# Patient Record
Sex: Male | Born: 1935 | Race: White | Hispanic: No | Marital: Married | Smoking: Former smoker
Health system: Southern US, Community
[De-identification: ages and names within clinical notes are randomized; demographics above are authoritative.]

## PROBLEM LIST (undated history)

## (undated) DIAGNOSIS — IMO0002 Reserved for concepts with insufficient information to code with codable children: Secondary | ICD-10-CM

## (undated) DIAGNOSIS — K8689 Other specified diseases of pancreas: Secondary | ICD-10-CM

## (undated) DIAGNOSIS — I472 Ventricular tachycardia: Secondary | ICD-10-CM

## (undated) DIAGNOSIS — I10 Essential (primary) hypertension: Secondary | ICD-10-CM

## (undated) DIAGNOSIS — C3490 Malignant neoplasm of unspecified part of unspecified bronchus or lung: Secondary | ICD-10-CM

## (undated) DIAGNOSIS — I2510 Atherosclerotic heart disease of native coronary artery without angina pectoris: Secondary | ICD-10-CM

## (undated) DIAGNOSIS — Z7901 Long term (current) use of anticoagulants: Secondary | ICD-10-CM

## (undated) DIAGNOSIS — N400 Enlarged prostate without lower urinary tract symptoms: Secondary | ICD-10-CM

## (undated) DIAGNOSIS — C439 Malignant melanoma of skin, unspecified: Secondary | ICD-10-CM

## (undated) DIAGNOSIS — S301XXA Contusion of abdominal wall, initial encounter: Secondary | ICD-10-CM

## (undated) DIAGNOSIS — I4891 Unspecified atrial fibrillation: Secondary | ICD-10-CM

## (undated) DIAGNOSIS — I4892 Unspecified atrial flutter: Secondary | ICD-10-CM

## (undated) DIAGNOSIS — Z923 Personal history of irradiation: Secondary | ICD-10-CM

## (undated) DIAGNOSIS — K922 Gastrointestinal hemorrhage, unspecified: Secondary | ICD-10-CM

## (undated) DIAGNOSIS — I4729 Other ventricular tachycardia: Secondary | ICD-10-CM

## (undated) DIAGNOSIS — K279 Peptic ulcer, site unspecified, unspecified as acute or chronic, without hemorrhage or perforation: Secondary | ICD-10-CM

## (undated) DIAGNOSIS — C4490 Unspecified malignant neoplasm of skin, unspecified: Secondary | ICD-10-CM

## (undated) DIAGNOSIS — R0989 Other specified symptoms and signs involving the circulatory and respiratory systems: Secondary | ICD-10-CM

## (undated) HISTORY — DX: Ventricular tachycardia: I47.2

## (undated) HISTORY — DX: Unspecified malignant neoplasm of skin, unspecified: C44.90

## (undated) HISTORY — PX: CHOLECYSTECTOMY: SHX55

## (undated) HISTORY — DX: Long term (current) use of anticoagulants: Z79.01

## (undated) HISTORY — PX: LUNG BIOPSY: SHX232

## (undated) HISTORY — PX: SKIN BIOPSY: SHX1

## (undated) HISTORY — PX: INGUINAL HERNIA REPAIR: SHX194

## (undated) HISTORY — DX: Unspecified atrial flutter: I48.92

## (undated) HISTORY — PX: THORACENTESIS: SHX235

## (undated) HISTORY — DX: Benign prostatic hyperplasia without lower urinary tract symptoms: N40.0

## (undated) HISTORY — DX: Malignant neoplasm of unspecified part of unspecified bronchus or lung: C34.90

## (undated) HISTORY — PX: OTHER SURGICAL HISTORY: SHX169

## (undated) HISTORY — PX: LAPAROSCOPIC GASTROTOMY W/ REPAIR OF ULCER: SUR772

## (undated) HISTORY — DX: Contusion of abdominal wall, initial encounter: S30.1XXA

## (undated) HISTORY — DX: Other specified symptoms and signs involving the circulatory and respiratory systems: R09.89

## (undated) HISTORY — PX: NECK DISSECTION: SUR422

## (undated) HISTORY — DX: Atherosclerotic heart disease of native coronary artery without angina pectoris: I25.10

## (undated) HISTORY — DX: Gastrointestinal hemorrhage, unspecified: K92.2

## (undated) HISTORY — DX: Other ventricular tachycardia: I47.29

---

## 2001-05-11 ENCOUNTER — Inpatient Hospital Stay (HOSPITAL_COMMUNITY): Admission: AD | Admit: 2001-05-11 | Discharge: 2001-05-14 | Payer: Self-pay | Admitting: Family Medicine

## 2001-05-11 ENCOUNTER — Encounter: Payer: Self-pay | Admitting: Family Medicine

## 2001-06-22 ENCOUNTER — Other Ambulatory Visit: Admission: RE | Admit: 2001-06-22 | Discharge: 2001-06-22 | Payer: Self-pay | Admitting: Dermatology

## 2001-07-20 ENCOUNTER — Other Ambulatory Visit: Admission: RE | Admit: 2001-07-20 | Discharge: 2001-07-20 | Payer: Self-pay | Admitting: Dermatology

## 2002-08-10 ENCOUNTER — Inpatient Hospital Stay (HOSPITAL_COMMUNITY): Admission: EM | Admit: 2002-08-10 | Discharge: 2002-08-16 | Payer: Self-pay | Admitting: Emergency Medicine

## 2002-08-11 ENCOUNTER — Encounter: Payer: Self-pay | Admitting: Family Medicine

## 2002-08-14 ENCOUNTER — Encounter: Payer: Self-pay | Admitting: Cardiology

## 2002-09-10 ENCOUNTER — Other Ambulatory Visit: Admission: RE | Admit: 2002-09-10 | Discharge: 2002-09-10 | Payer: Self-pay | Admitting: Dermatology

## 2003-02-25 ENCOUNTER — Other Ambulatory Visit: Admission: RE | Admit: 2003-02-25 | Discharge: 2003-02-25 | Payer: Self-pay | Admitting: Dermatology

## 2003-05-06 ENCOUNTER — Other Ambulatory Visit: Admission: RE | Admit: 2003-05-06 | Discharge: 2003-05-06 | Payer: Self-pay | Admitting: Dermatology

## 2003-08-10 DIAGNOSIS — IMO0002 Reserved for concepts with insufficient information to code with codable children: Secondary | ICD-10-CM

## 2003-08-10 HISTORY — DX: Reserved for concepts with insufficient information to code with codable children: IMO0002

## 2003-11-08 HISTORY — PX: SKIN GRAFT: SHX250

## 2004-02-04 ENCOUNTER — Encounter: Admission: RE | Admit: 2004-02-04 | Discharge: 2004-02-04 | Payer: Self-pay | Admitting: Dentistry

## 2004-02-12 ENCOUNTER — Ambulatory Visit: Admission: RE | Admit: 2004-02-12 | Discharge: 2004-05-12 | Payer: Self-pay | Admitting: Radiation Oncology

## 2004-10-19 ENCOUNTER — Ambulatory Visit (HOSPITAL_COMMUNITY): Admission: RE | Admit: 2004-10-19 | Discharge: 2004-10-19 | Payer: Self-pay | Admitting: Unknown Physician Specialty

## 2005-08-09 DIAGNOSIS — C3490 Malignant neoplasm of unspecified part of unspecified bronchus or lung: Secondary | ICD-10-CM

## 2005-08-09 HISTORY — DX: Malignant neoplasm of unspecified part of unspecified bronchus or lung: C34.90

## 2005-08-18 ENCOUNTER — Ambulatory Visit (HOSPITAL_COMMUNITY): Admission: RE | Admit: 2005-08-18 | Discharge: 2005-08-18 | Payer: Self-pay | Admitting: Family Medicine

## 2005-08-21 ENCOUNTER — Inpatient Hospital Stay (HOSPITAL_COMMUNITY): Admission: EM | Admit: 2005-08-21 | Discharge: 2005-08-25 | Payer: Self-pay | Admitting: Emergency Medicine

## 2005-09-02 ENCOUNTER — Inpatient Hospital Stay (HOSPITAL_COMMUNITY): Admission: RE | Admit: 2005-09-02 | Discharge: 2005-09-10 | Payer: Self-pay | Admitting: Family Medicine

## 2005-09-06 ENCOUNTER — Encounter (INDEPENDENT_AMBULATORY_CARE_PROVIDER_SITE_OTHER): Payer: Self-pay | Admitting: *Deleted

## 2005-09-06 ENCOUNTER — Encounter (INDEPENDENT_AMBULATORY_CARE_PROVIDER_SITE_OTHER): Payer: Self-pay | Admitting: Specialist

## 2005-09-07 ENCOUNTER — Encounter: Payer: Self-pay | Admitting: Thoracic Surgery

## 2005-09-08 ENCOUNTER — Encounter: Payer: Self-pay | Admitting: Thoracic Surgery

## 2005-09-09 ENCOUNTER — Ambulatory Visit: Payer: Self-pay | Admitting: Internal Medicine

## 2005-09-11 ENCOUNTER — Ambulatory Visit: Admission: RE | Admit: 2005-09-11 | Discharge: 2005-12-10 | Payer: Self-pay | Admitting: Radiation Oncology

## 2005-09-15 ENCOUNTER — Encounter: Admission: RE | Admit: 2005-09-15 | Discharge: 2005-09-15 | Payer: Self-pay | Admitting: Thoracic Surgery

## 2005-09-17 ENCOUNTER — Encounter (HOSPITAL_COMMUNITY): Admission: RE | Admit: 2005-09-17 | Discharge: 2005-10-17 | Payer: Self-pay | Admitting: Oncology

## 2005-09-17 ENCOUNTER — Encounter: Admission: RE | Admit: 2005-09-17 | Discharge: 2005-09-17 | Payer: Self-pay | Admitting: Oncology

## 2005-09-17 ENCOUNTER — Ambulatory Visit (HOSPITAL_COMMUNITY): Payer: Self-pay | Admitting: Oncology

## 2005-09-23 ENCOUNTER — Ambulatory Visit (HOSPITAL_COMMUNITY): Admission: RE | Admit: 2005-09-23 | Discharge: 2005-09-23 | Payer: Self-pay | Admitting: General Surgery

## 2005-09-24 ENCOUNTER — Inpatient Hospital Stay (HOSPITAL_COMMUNITY): Admission: EM | Admit: 2005-09-24 | Discharge: 2005-09-27 | Payer: Self-pay | Admitting: Emergency Medicine

## 2005-09-24 ENCOUNTER — Ambulatory Visit: Payer: Self-pay | Admitting: Cardiology

## 2005-10-01 ENCOUNTER — Emergency Department (HOSPITAL_COMMUNITY): Admission: EM | Admit: 2005-10-01 | Discharge: 2005-10-01 | Payer: Self-pay | Admitting: Emergency Medicine

## 2005-10-05 ENCOUNTER — Emergency Department (HOSPITAL_COMMUNITY): Admission: EM | Admit: 2005-10-05 | Discharge: 2005-10-05 | Payer: Self-pay | Admitting: Emergency Medicine

## 2005-10-18 ENCOUNTER — Encounter (HOSPITAL_COMMUNITY): Admission: RE | Admit: 2005-10-18 | Discharge: 2005-11-17 | Payer: Self-pay | Admitting: Oncology

## 2005-10-18 ENCOUNTER — Encounter: Admission: RE | Admit: 2005-10-18 | Discharge: 2005-10-18 | Payer: Self-pay | Admitting: Oncology

## 2005-10-26 ENCOUNTER — Emergency Department (HOSPITAL_COMMUNITY): Admission: EM | Admit: 2005-10-26 | Discharge: 2005-10-26 | Payer: Self-pay | Admitting: Emergency Medicine

## 2005-10-27 ENCOUNTER — Emergency Department (HOSPITAL_COMMUNITY): Admission: EM | Admit: 2005-10-27 | Discharge: 2005-10-27 | Payer: Self-pay | Admitting: Emergency Medicine

## 2005-11-22 ENCOUNTER — Encounter (HOSPITAL_COMMUNITY): Admission: RE | Admit: 2005-11-22 | Discharge: 2005-12-22 | Payer: Self-pay | Admitting: Oncology

## 2005-11-22 ENCOUNTER — Ambulatory Visit (HOSPITAL_COMMUNITY): Payer: Self-pay | Admitting: Oncology

## 2005-11-22 ENCOUNTER — Encounter: Admission: RE | Admit: 2005-11-22 | Discharge: 2005-11-22 | Payer: Self-pay | Admitting: Oncology

## 2006-01-05 ENCOUNTER — Emergency Department (HOSPITAL_COMMUNITY): Admission: EM | Admit: 2006-01-05 | Discharge: 2006-01-05 | Payer: Self-pay | Admitting: Emergency Medicine

## 2006-01-06 ENCOUNTER — Encounter (HOSPITAL_COMMUNITY): Admission: RE | Admit: 2006-01-06 | Discharge: 2006-02-05 | Payer: Self-pay | Admitting: Oncology

## 2006-01-06 ENCOUNTER — Encounter: Admission: RE | Admit: 2006-01-06 | Discharge: 2006-01-06 | Payer: Self-pay | Admitting: Oncology

## 2006-01-09 ENCOUNTER — Ambulatory Visit (HOSPITAL_COMMUNITY): Payer: Self-pay | Admitting: Oncology

## 2006-01-13 ENCOUNTER — Inpatient Hospital Stay (HOSPITAL_COMMUNITY): Admission: EM | Admit: 2006-01-13 | Discharge: 2006-01-17 | Payer: Self-pay | Admitting: Emergency Medicine

## 2006-02-18 ENCOUNTER — Encounter: Admission: RE | Admit: 2006-02-18 | Discharge: 2006-02-18 | Payer: Self-pay | Admitting: Oncology

## 2006-02-18 ENCOUNTER — Encounter (HOSPITAL_COMMUNITY): Admission: RE | Admit: 2006-02-18 | Discharge: 2006-03-20 | Payer: Self-pay | Admitting: Oncology

## 2006-02-28 ENCOUNTER — Emergency Department (HOSPITAL_COMMUNITY): Admission: EM | Admit: 2006-02-28 | Discharge: 2006-02-28 | Payer: Self-pay | Admitting: Emergency Medicine

## 2006-03-10 ENCOUNTER — Ambulatory Visit (HOSPITAL_COMMUNITY): Payer: Self-pay | Admitting: Oncology

## 2006-03-21 ENCOUNTER — Encounter (HOSPITAL_COMMUNITY): Admission: RE | Admit: 2006-03-21 | Discharge: 2006-04-20 | Payer: Self-pay | Admitting: Oncology

## 2006-03-21 ENCOUNTER — Encounter: Admission: RE | Admit: 2006-03-21 | Discharge: 2006-03-21 | Payer: Self-pay | Admitting: Oncology

## 2006-04-25 ENCOUNTER — Encounter (HOSPITAL_COMMUNITY): Admission: RE | Admit: 2006-04-25 | Discharge: 2006-05-06 | Payer: Self-pay | Admitting: Oncology

## 2006-04-25 ENCOUNTER — Ambulatory Visit (HOSPITAL_COMMUNITY): Payer: Self-pay | Admitting: Oncology

## 2006-04-25 ENCOUNTER — Encounter: Admission: RE | Admit: 2006-04-25 | Discharge: 2006-05-06 | Payer: Self-pay | Admitting: Oncology

## 2006-05-09 ENCOUNTER — Encounter (HOSPITAL_COMMUNITY): Admission: RE | Admit: 2006-05-09 | Discharge: 2006-06-08 | Payer: Self-pay | Admitting: Oncology

## 2006-05-09 ENCOUNTER — Encounter: Admission: RE | Admit: 2006-05-09 | Discharge: 2006-05-09 | Payer: Self-pay | Admitting: Oncology

## 2006-07-12 ENCOUNTER — Ambulatory Visit (HOSPITAL_COMMUNITY): Payer: Self-pay | Admitting: Oncology

## 2006-07-12 ENCOUNTER — Encounter (HOSPITAL_COMMUNITY): Admission: RE | Admit: 2006-07-12 | Discharge: 2006-08-08 | Payer: Self-pay | Admitting: Oncology

## 2006-08-06 ENCOUNTER — Emergency Department (HOSPITAL_COMMUNITY): Admission: EM | Admit: 2006-08-06 | Discharge: 2006-08-06 | Payer: Self-pay | Admitting: Emergency Medicine

## 2006-08-29 ENCOUNTER — Emergency Department (HOSPITAL_COMMUNITY): Admission: EM | Admit: 2006-08-29 | Discharge: 2006-08-29 | Payer: Self-pay | Admitting: Emergency Medicine

## 2006-08-30 ENCOUNTER — Inpatient Hospital Stay (HOSPITAL_COMMUNITY): Admission: EM | Admit: 2006-08-30 | Discharge: 2006-09-09 | Payer: Self-pay | Admitting: Emergency Medicine

## 2006-09-01 ENCOUNTER — Encounter (INDEPENDENT_AMBULATORY_CARE_PROVIDER_SITE_OTHER): Payer: Self-pay | Admitting: Specialist

## 2006-09-02 ENCOUNTER — Ambulatory Visit: Payer: Self-pay | Admitting: Cardiology

## 2006-10-03 ENCOUNTER — Emergency Department (HOSPITAL_COMMUNITY): Admission: EM | Admit: 2006-10-03 | Discharge: 2006-10-04 | Payer: Self-pay | Admitting: Emergency Medicine

## 2006-10-12 ENCOUNTER — Ambulatory Visit (HOSPITAL_COMMUNITY): Payer: Self-pay | Admitting: Oncology

## 2006-10-16 ENCOUNTER — Inpatient Hospital Stay (HOSPITAL_COMMUNITY): Admission: EM | Admit: 2006-10-16 | Discharge: 2006-10-18 | Payer: Self-pay | Admitting: Emergency Medicine

## 2006-10-19 ENCOUNTER — Inpatient Hospital Stay (HOSPITAL_COMMUNITY): Admission: EM | Admit: 2006-10-19 | Discharge: 2006-10-21 | Payer: Self-pay | Admitting: Emergency Medicine

## 2006-10-27 ENCOUNTER — Inpatient Hospital Stay (HOSPITAL_COMMUNITY): Admission: EM | Admit: 2006-10-27 | Discharge: 2006-10-29 | Payer: Self-pay | Admitting: Emergency Medicine

## 2006-11-09 ENCOUNTER — Encounter (HOSPITAL_COMMUNITY): Admission: RE | Admit: 2006-11-09 | Discharge: 2006-12-09 | Payer: Self-pay | Admitting: Oncology

## 2006-11-16 ENCOUNTER — Inpatient Hospital Stay (HOSPITAL_COMMUNITY): Admission: EM | Admit: 2006-11-16 | Discharge: 2006-11-18 | Payer: Self-pay | Admitting: Emergency Medicine

## 2006-11-16 ENCOUNTER — Ambulatory Visit: Payer: Self-pay | Admitting: Oncology

## 2006-11-25 ENCOUNTER — Encounter (HOSPITAL_COMMUNITY): Admission: RE | Admit: 2006-11-25 | Discharge: 2006-12-25 | Payer: Self-pay | Admitting: Family Medicine

## 2006-12-27 ENCOUNTER — Encounter (HOSPITAL_COMMUNITY): Admission: RE | Admit: 2006-12-27 | Discharge: 2007-01-26 | Payer: Self-pay | Admitting: Family Medicine

## 2007-01-09 ENCOUNTER — Ambulatory Visit (HOSPITAL_COMMUNITY): Payer: Self-pay | Admitting: Oncology

## 2007-01-09 ENCOUNTER — Encounter (HOSPITAL_COMMUNITY): Admission: RE | Admit: 2007-01-09 | Discharge: 2007-02-08 | Payer: Self-pay | Admitting: Oncology

## 2007-03-17 ENCOUNTER — Ambulatory Visit (HOSPITAL_COMMUNITY): Payer: Self-pay | Admitting: Oncology

## 2007-03-17 ENCOUNTER — Encounter (HOSPITAL_COMMUNITY): Admission: RE | Admit: 2007-03-17 | Discharge: 2007-04-16 | Payer: Self-pay | Admitting: Oncology

## 2007-06-12 ENCOUNTER — Encounter (HOSPITAL_COMMUNITY): Admission: RE | Admit: 2007-06-12 | Discharge: 2007-07-12 | Payer: Self-pay | Admitting: Oncology

## 2007-06-12 ENCOUNTER — Ambulatory Visit (HOSPITAL_COMMUNITY): Payer: Self-pay | Admitting: Oncology

## 2007-09-04 ENCOUNTER — Ambulatory Visit (HOSPITAL_COMMUNITY): Payer: Self-pay | Admitting: Oncology

## 2007-11-14 ENCOUNTER — Encounter (HOSPITAL_COMMUNITY): Admission: RE | Admit: 2007-11-14 | Discharge: 2007-12-14 | Payer: Self-pay | Admitting: Oncology

## 2007-11-14 ENCOUNTER — Ambulatory Visit (HOSPITAL_COMMUNITY): Payer: Self-pay | Admitting: Oncology

## 2008-02-23 ENCOUNTER — Ambulatory Visit (HOSPITAL_COMMUNITY): Payer: Self-pay | Admitting: Oncology

## 2008-03-11 ENCOUNTER — Ambulatory Visit (HOSPITAL_COMMUNITY): Admission: RE | Admit: 2008-03-11 | Discharge: 2008-03-11 | Payer: Self-pay | Admitting: Ophthalmology

## 2008-04-05 ENCOUNTER — Encounter (HOSPITAL_COMMUNITY): Admission: RE | Admit: 2008-04-05 | Discharge: 2008-05-05 | Payer: Self-pay | Admitting: Oncology

## 2008-04-29 ENCOUNTER — Ambulatory Visit (HOSPITAL_COMMUNITY): Admission: RE | Admit: 2008-04-29 | Discharge: 2008-04-29 | Payer: Self-pay | Admitting: Ophthalmology

## 2008-05-17 ENCOUNTER — Ambulatory Visit (HOSPITAL_COMMUNITY): Payer: Self-pay | Admitting: Oncology

## 2008-05-20 ENCOUNTER — Ambulatory Visit (HOSPITAL_COMMUNITY): Admission: RE | Admit: 2008-05-20 | Discharge: 2008-05-20 | Payer: Self-pay | Admitting: Oncology

## 2008-11-25 ENCOUNTER — Ambulatory Visit (HOSPITAL_COMMUNITY): Payer: Self-pay | Admitting: Oncology

## 2008-11-25 ENCOUNTER — Encounter (HOSPITAL_COMMUNITY): Admission: RE | Admit: 2008-11-25 | Discharge: 2008-12-25 | Payer: Self-pay | Admitting: Oncology

## 2009-01-13 ENCOUNTER — Ambulatory Visit (HOSPITAL_COMMUNITY): Payer: Self-pay | Admitting: Oncology

## 2009-04-07 ENCOUNTER — Ambulatory Visit (HOSPITAL_COMMUNITY): Payer: Self-pay | Admitting: Oncology

## 2009-04-09 DIAGNOSIS — I10 Essential (primary) hypertension: Secondary | ICD-10-CM | POA: Insufficient documentation

## 2009-04-09 DIAGNOSIS — I2510 Atherosclerotic heart disease of native coronary artery without angina pectoris: Secondary | ICD-10-CM | POA: Insufficient documentation

## 2009-05-19 ENCOUNTER — Encounter (HOSPITAL_COMMUNITY): Admission: RE | Admit: 2009-05-19 | Discharge: 2009-06-18 | Payer: Self-pay | Admitting: Oncology

## 2009-06-03 ENCOUNTER — Ambulatory Visit (HOSPITAL_COMMUNITY): Payer: Self-pay | Admitting: Oncology

## 2009-07-15 ENCOUNTER — Encounter (HOSPITAL_COMMUNITY): Admission: RE | Admit: 2009-07-15 | Discharge: 2009-08-06 | Payer: Self-pay | Admitting: Oncology

## 2009-07-24 ENCOUNTER — Ambulatory Visit (HOSPITAL_COMMUNITY): Admission: RE | Admit: 2009-07-24 | Discharge: 2009-07-24 | Payer: Self-pay | Admitting: Urology

## 2009-08-26 ENCOUNTER — Ambulatory Visit (HOSPITAL_COMMUNITY): Payer: Self-pay | Admitting: Oncology

## 2009-09-30 ENCOUNTER — Encounter (HOSPITAL_COMMUNITY): Admission: RE | Admit: 2009-09-30 | Discharge: 2009-10-30 | Payer: Self-pay | Admitting: Oncology

## 2009-11-11 ENCOUNTER — Encounter (HOSPITAL_COMMUNITY): Admission: RE | Admit: 2009-11-11 | Discharge: 2009-12-11 | Payer: Self-pay | Admitting: Oncology

## 2009-11-11 ENCOUNTER — Ambulatory Visit (HOSPITAL_COMMUNITY): Payer: Self-pay | Admitting: Oncology

## 2010-02-05 ENCOUNTER — Ambulatory Visit (HOSPITAL_COMMUNITY): Payer: Self-pay | Admitting: Oncology

## 2010-04-30 ENCOUNTER — Ambulatory Visit (HOSPITAL_COMMUNITY): Payer: Self-pay | Admitting: Oncology

## 2010-06-04 ENCOUNTER — Encounter (HOSPITAL_COMMUNITY)
Admission: RE | Admit: 2010-06-04 | Discharge: 2010-07-04 | Payer: Self-pay | Source: Home / Self Care | Admitting: Oncology

## 2010-06-24 ENCOUNTER — Ambulatory Visit (HOSPITAL_COMMUNITY): Payer: Self-pay | Admitting: Oncology

## 2010-07-16 ENCOUNTER — Encounter (HOSPITAL_COMMUNITY)
Admission: RE | Admit: 2010-07-16 | Discharge: 2010-08-15 | Payer: Self-pay | Source: Home / Self Care | Attending: Oncology | Admitting: Oncology

## 2010-08-27 ENCOUNTER — Ambulatory Visit (HOSPITAL_COMMUNITY): Admit: 2010-08-27 | Payer: Self-pay | Admitting: Oncology

## 2010-08-27 ENCOUNTER — Encounter (HOSPITAL_COMMUNITY): Admission: RE | Admit: 2010-08-27 | Payer: Self-pay | Source: Home / Self Care | Admitting: Oncology

## 2010-08-28 ENCOUNTER — Other Ambulatory Visit (HOSPITAL_COMMUNITY): Payer: Self-pay | Admitting: Oncology

## 2010-08-28 DIAGNOSIS — C3490 Malignant neoplasm of unspecified part of unspecified bronchus or lung: Secondary | ICD-10-CM

## 2010-08-29 ENCOUNTER — Encounter (HOSPITAL_COMMUNITY): Payer: Self-pay | Admitting: Oncology

## 2010-08-30 ENCOUNTER — Encounter (HOSPITAL_COMMUNITY): Payer: Self-pay | Admitting: Oncology

## 2010-08-30 ENCOUNTER — Encounter: Payer: Self-pay | Admitting: Urology

## 2010-10-08 ENCOUNTER — Encounter (HOSPITAL_COMMUNITY): Payer: Medicare Other

## 2010-10-09 ENCOUNTER — Encounter (HOSPITAL_COMMUNITY): Payer: Medicare Other

## 2010-10-09 DIAGNOSIS — Z452 Encounter for adjustment and management of vascular access device: Secondary | ICD-10-CM

## 2010-10-09 DIAGNOSIS — C3490 Malignant neoplasm of unspecified part of unspecified bronchus or lung: Secondary | ICD-10-CM

## 2010-10-21 LAB — CBC
MCH: 29.2 pg (ref 26.0–34.0)
MCV: 89.5 fL (ref 78.0–100.0)
RBC: 4.65 MIL/uL (ref 4.22–5.81)
WBC: 11.9 10*3/uL — ABNORMAL HIGH (ref 4.0–10.5)

## 2010-10-21 LAB — COMPREHENSIVE METABOLIC PANEL
ALT: 14 U/L (ref 0–53)
AST: 22 U/L (ref 0–37)
Albumin: 3.3 g/dL — ABNORMAL LOW (ref 3.5–5.2)
Alkaline Phosphatase: 74 U/L (ref 39–117)
CO2: 27 mEq/L (ref 19–32)
Calcium: 8.4 mg/dL (ref 8.4–10.5)
Chloride: 106 mEq/L (ref 96–112)
Creatinine, Ser: 0.97 mg/dL (ref 0.4–1.5)
Potassium: 3.9 mEq/L (ref 3.5–5.1)
Total Bilirubin: 0.4 mg/dL (ref 0.3–1.2)

## 2010-10-21 LAB — DIFFERENTIAL
Eosinophils Absolute: 2.8 10*3/uL — ABNORMAL HIGH (ref 0.0–0.7)
Lymphocytes Relative: 14 % (ref 12–46)
Monocytes Absolute: 0.6 10*3/uL (ref 0.1–1.0)
Neutro Abs: 6.7 10*3/uL (ref 1.7–7.7)

## 2010-10-27 LAB — POTASSIUM: Potassium: 3.9 mEq/L (ref 3.5–5.1)

## 2010-10-28 LAB — POTASSIUM: Potassium: 3.9 mEq/L (ref 3.5–5.1)

## 2010-10-28 LAB — COMPREHENSIVE METABOLIC PANEL
ALT: 12 U/L (ref 0–53)
CO2: 25 mEq/L (ref 19–32)
Chloride: 99 mEq/L (ref 96–112)
Creatinine, Ser: 1.02 mg/dL (ref 0.4–1.5)
Total Protein: 6.6 g/dL (ref 6.0–8.3)

## 2010-10-28 LAB — CBC
Hemoglobin: 14 g/dL (ref 13.0–17.0)
MCHC: 33 g/dL (ref 30.0–36.0)
MCV: 86.9 fL (ref 78.0–100.0)
Platelets: 169 10*3/uL (ref 150–400)
RBC: 4.89 MIL/uL (ref 4.22–5.81)
RDW: 16.1 % — ABNORMAL HIGH (ref 11.5–15.5)
WBC: 9.7 10*3/uL (ref 4.0–10.5)

## 2010-10-28 LAB — DIFFERENTIAL
Eosinophils Absolute: 0.7 10*3/uL (ref 0.0–0.7)
Lymphocytes Relative: 16 % (ref 12–46)
Lymphs Abs: 1.5 10*3/uL (ref 0.7–4.0)
Monocytes Relative: 7 % (ref 3–12)
Neutrophils Relative %: 69 % (ref 43–77)

## 2010-11-10 LAB — POTASSIUM: Potassium: 3.4 mEq/L — ABNORMAL LOW (ref 3.5–5.1)

## 2010-11-12 LAB — CBC
HCT: 45.2 % (ref 39.0–52.0)
Hemoglobin: 14.7 g/dL (ref 13.0–17.0)
MCV: 89.4 fL (ref 78.0–100.0)
Platelets: 257 10*3/uL (ref 150–400)
WBC: 9.2 10*3/uL (ref 4.0–10.5)

## 2010-11-12 LAB — COMPREHENSIVE METABOLIC PANEL
Alkaline Phosphatase: 92 U/L (ref 39–117)
BUN: 11 mg/dL (ref 6–23)
CO2: 29 mEq/L (ref 19–32)
Chloride: 102 mEq/L (ref 96–112)
Creatinine, Ser: 1.11 mg/dL (ref 0.4–1.5)
GFR calc non Af Amer: >60 mL/min (ref >60)
Glucose, Bld: 83 mg/dL (ref 70–99)
Potassium: 3.4 mEq/L — ABNORMAL LOW (ref 3.5–5.1)
Total Bilirubin: 0.6 mg/dL (ref 0.3–1.2)

## 2010-11-12 LAB — DIFFERENTIAL
Basophils Absolute: 0.1 10*3/uL (ref 0.0–0.1)
Basophils Relative: 1 % (ref 0–1)
Lymphocytes Relative: 14 % (ref 12–46)
Monocytes Absolute: 0.6 10*3/uL (ref 0.1–1.0)
Neutro Abs: 6.7 10*3/uL (ref 1.7–7.7)
Neutrophils Relative %: 73 % (ref 43–77)

## 2010-11-18 LAB — CBC
HCT: 44.2 % (ref 39.0–52.0)
Hemoglobin: 14.7 g/dL (ref 13.0–17.0)
RBC: 5.04 MIL/uL (ref 4.22–5.81)
RDW: 14.8 % (ref 11.5–15.5)
WBC: 7.9 10*3/uL (ref 4.0–10.5)

## 2010-11-18 LAB — COMPREHENSIVE METABOLIC PANEL
ALT: 19 U/L (ref 0–53)
Alkaline Phosphatase: 79 U/L (ref 39–117)
Chloride: 105 mEq/L (ref 96–112)
Glucose, Bld: 108 mg/dL — ABNORMAL HIGH (ref 70–99)
Potassium: 3.6 mEq/L (ref 3.5–5.1)
Sodium: 137 mEq/L (ref 135–145)
Total Bilirubin: 0.4 mg/dL (ref 0.3–1.2)
Total Protein: 6.5 g/dL (ref 6.0–8.3)

## 2010-11-18 LAB — DIFFERENTIAL
Basophils Absolute: 0 10*3/uL (ref 0.0–0.1)
Basophils Relative: 0 % (ref 0–1)
Eosinophils Absolute: 0.5 10*3/uL (ref 0.0–0.7)
Monocytes Absolute: 0.5 10*3/uL (ref 0.1–1.0)
Monocytes Relative: 6 % (ref 3–12)
Neutrophils Relative %: 74 % (ref 43–77)

## 2010-11-19 ENCOUNTER — Other Ambulatory Visit (HOSPITAL_COMMUNITY): Payer: Self-pay

## 2010-12-02 ENCOUNTER — Ambulatory Visit (HOSPITAL_COMMUNITY): Payer: Medicare Other

## 2010-12-02 ENCOUNTER — Ambulatory Visit (HOSPITAL_COMMUNITY)
Admission: RE | Admit: 2010-12-02 | Discharge: 2010-12-02 | Disposition: A | Discharge status: Home / Self Care | Payer: Medicare Other | Source: Ambulatory Visit | Attending: Oncology | Admitting: Oncology

## 2010-12-02 DIAGNOSIS — I700 Atherosclerosis of aorta: Secondary | ICD-10-CM | POA: Insufficient documentation

## 2010-12-02 DIAGNOSIS — I2510 Atherosclerotic heart disease of native coronary artery without angina pectoris: Secondary | ICD-10-CM | POA: Insufficient documentation

## 2010-12-02 DIAGNOSIS — C3490 Malignant neoplasm of unspecified part of unspecified bronchus or lung: Secondary | ICD-10-CM | POA: Insufficient documentation

## 2010-12-09 ENCOUNTER — Other Ambulatory Visit (HOSPITAL_COMMUNITY): Payer: Self-pay | Admitting: Oncology

## 2010-12-09 ENCOUNTER — Encounter (HOSPITAL_COMMUNITY): Payer: Medicare Other | Attending: Oncology | Admitting: Oncology

## 2010-12-09 ENCOUNTER — Ambulatory Visit (HOSPITAL_COMMUNITY): Payer: Self-pay | Admitting: Oncology

## 2010-12-09 DIAGNOSIS — C3490 Malignant neoplasm of unspecified part of unspecified bronchus or lung: Secondary | ICD-10-CM

## 2010-12-16 ENCOUNTER — Encounter (HOSPITAL_COMMUNITY): Payer: Medicare Other

## 2010-12-25 NOTE — H&P (Signed)
Stephen Martin, Stephen Martin                ACCOUNT NO.:  [id number]   MEDICAL RECORD NO.:  [id number]          PATIENT TYPE:  INP   LOCATION:  A227                          FACILITY:  [facility]   PHYSICIAN:  Dennie Maizes, [name]   DATE OF BIRTH:  Oct 02, 1930   DATE OF ADMISSION:  11/16/2006  DATE OF DISCHARGE:  LH                              HISTORY & PHYSICAL   CHIEF COMPLAINT:  Hematuria, clot retention, transurethral resection of  the prostate in January of 2008.   HISTORY OF PRESENT ILLNESS:  This 77 year old male is known to me from  prior evaluation and surgery.  He had BPH and bladder neck obstruction  and associated urinary retention.  He was long term Foley catheter  drainage while undergoing chemotherapy for lung cancer.  After  completion of the chemotherapy, the patient was taken to the operating  room for transurethral resection of the prostate in January of 2008.  The patient had significant postoperative bleeding which was thought to  be secondary to dysfunctional platelets.  He had multiple blood clots.  The patient has had several  transfusions.  The patient had been  admitted to the hospital on several occasions with clot retention.  He  was taken to the OR about 3 weeks ago with clot retention.  Cystoscopy  evacuation of blood clots and fulguration of prostate after that.  The  patient has had no trouble in 3 weeks.   He started having hematuria about 2 a.m. today.  He passed several blood  clots.  He was brought to the emergency room.  He was started on IV  fluids.  A Foley catheter was inserted into the bladder and continuous  bladder irrigation was started.  I did bladder irrigation for about 30  minutes and saw the large blood clots.  I still could not get all the  blood clots out.  The patient will be taken to the operating room today  for cystoscopy, irrigation of blood clots and fulguration of the  prostate.  The patient also received blood transfusion in the  emergency  room.  I plan to transfuse him with  platelets later.   PAST MEDICAL HISTORY:  BPH, bladder neck obstruction, urinary retention,  status post TURP in January of 2008, history of lung cancer, status post  chemotherapy, hypertension, urinary tract infection.   MEDICATIONS:  1. Xanax 0.25 mg p.o. b.i.d. p.r.n.   ALLERGIES:  NONE.   PAST SURGICAL HISTORY:  Hernia surgery, cardiac catheterization, TURP in  2008.   PHYSICAL EXAMINATION:  GENERAL:  The patient is comfortable at the time  of examination.  VITAL SIGNS:  Blood pressure 100/70.  HEENT:  Essentially normal.  NECK:  No masses.  LUNGS:  Clear to auscultation.  HEART:  Regular rate and rhythm.  No murmurs.  ABDOMEN:  Soft.  No palpable mass. Bladder is distended.  GENITOURINARY:  Penis and testes were normal.   A 24-French triple-lumen Foley catheter with 30 cc balloon was inserted  into the bladder.  Continuous bladder irrigation was started with  sterile saline.  A large amount of  blood clots were removed.  The  hematuria did not clear up with the bladder irrigation.   IMPRESSION:  Hematuria, clot retention, post TURP in January of 2008,  dysfunctional platelets, thrombocytopenia.   PLAN:  1. Transfuse 2 units of packed red blood cells.  2. Transfuse 1 pack of __________ platelets.  3. Cystoscopy, evacuation of blood clots and fulguration of prostate      as soon as possible.      Dennie Maizes, [name]  Electronically Signed     SK/MEDQ  D:  11/16/2006  T:  11/16/2006  Job:  [id number]   cc:   [name]. [name], MD  Fax: [phone]   [name], MD  Fax: [phone]

## 2010-12-25 NOTE — Procedures (Signed)
Stephen Martin, Stephen Martin                ACCOUNT NO.:  [id number]   MEDICAL RECORD NO.:  [id number]          PATIENT TYPE:  INP   LOCATION:  IC10                          FACILITY:  [facility]   PHYSICIAN:  Gerrit Friends. Dietrich Pates, MD, FACCDATE OF BIRTH:  1931/05/01   DATE OF PROCEDURE:  09/02/2006  DATE OF DISCHARGE:                                ECHOCARDIOGRAM   CLINICAL DATA:  A 77 year old gentleman with urinary tract bleeding and  hypotension; mildly elevated troponin.   M-MODE:  Aorta 3.3, left atrium 4.2, septum 1.4, posterior wall 1.3, LV  diastole 3.8, LV systole 2.9.   1. Technically suboptimal but adequate echocardiographic study.      Normal left atrium, right atrium and right ventricle.  2. Normal aortic root with very mild calcification of the wall.  3. Normal trileaflet aortic valve.  4. Slight mitral valve thickening with mild annular calcification.  5. Normal tricuspid and pulmonic valves; normal proximal pulmonary      artery.  6. Normal left ventricular size; mild hypertrophy; normal regional and      global function.  7. Comparison to prior study of September 24, 2005:  No significant      change.      Gerrit Friends. Dietrich Pates, MD, [facility]  Electronically Signed     RMR/MEDQ  D:  09/02/2006  T:  09/02/2006  Job:  [id number]

## 2010-12-25 NOTE — Consult Note (Signed)
NAME:  Stephen Martin, Stephen Martin                ACCOUNT NO.:  [id number]   MEDICAL RECORD NO.:  [id number]          PATIENT TYPE:  INP   LOCATION:  A227                          FACILITY:  [facility]   PHYSICIAN:  [name], MD    DATE OF BIRTH:  09-29-1930   DATE OF CONSULTATION:  11/16/2006  DATE OF DISCHARGE:                                 CONSULTATION   I was consulted to see the patient for medical management.  The patient  was admitted in with hematuria and anemia.  The patient has had  recurring problems with hematuria.  He has had prostate resection done  and has it fulgurated a couple of different times with several  readmissions.  There is also a history of lung cancer, chemotherapy  treatment in the past, also hyperlipidemia and has had atrial  fibrillation in the past.  Currently, today his lungs are clear.  His  heart is regular.  Pulse normal.  BP good.  Abdomen is soft.  Extremities have no significant edema A/P.   Hematuria:  Urology has done a fulguration of his prostate along with  having irrigation going.  They have also transfused him as well as given  him platelets.  This should gradually get better.  Overall, he should do  okay but we will need to watch closely, monitoring blood pressure, heart  rate, platelet count and Dr. [name] is consulting.  Follow up if  ongoing trouble.      [name], MD  Electronically Signed     SAL/MEDQ  D:  11/16/2006  T:  11/17/2006  Job:  [id number]

## 2010-12-25 NOTE — Group Therapy Note (Signed)
[name], Stephen Martin                ACCOUNT NO.:  [id number]   MEDICAL RECORD NO.:  [id number]          PATIENT TYPE:  INP   LOCATION:  IC10                          FACILITY:  [facility]   PHYSICIAN:  Dennie Maizes, [name]   DATE OF BIRTH:  01/02/1931   DATE OF PROCEDURE:  09/01/2006  DATE OF DISCHARGE:                                 PROGRESS NOTE   This 77 year old male had urinary retention, BPH and bladder outlet  obstruction.  Has undergone transurethral resection of the prostate this  morning.  He had a large prostate.  His blood loss was about 300 mL with  bladder irrigation.  The urine was clear.  At 6:30 p.m., I saw this  patient for a postoperative follow-up.  He was found to have brisk  bleeding in the catheter.  I inflated the bladder with about 50 mL of  water and Foley traction to the thigh was done.  This did not stop the  bleeding.  The patient felt faint and his blood pressure was 60/30 at  this time.  The patient was placed in the Trendelenburg position and 400  mL of Ringers lactate was given.  This brought up the blood pressure.  One unit of packed red blood cells was also transfused at this time.  As  the patient had significant hematuria, I decided to take him to the OR.  Informed the family about the emergency surgery.  The patient was taken  to the OR at 7:15 p.m.   Under general anesthesia, cystoscopy, evacuation of blood clots and  fulguration of the prostate were done.  Please refer to the operative  report.  Dr. Jerre Simon helped me during the procedure.  Even after the  fulguration, the patient had mild to moderate bleeding.  His PT at this  time was 15.5.  Bleeding time was 6 minutes.  PTT was 35 seconds.  The  patient received two more units of packed red blood cells in the  operating room.  His blood pressure remained stable.  His hemoglobin was  11.9 after the second transfusion.   At 9:15 p.m., the patient was transferred to the ICU.  He remained  stable with  a blood pressure of 130/80.  His hemoglobin in the ICU was  12.4.  I consulted with hematologist/oncologist at [facility].  The patient received two units of fresh frozen plasma.  He also received  Amicar intravesical irrigation.  The hematuria cleared at this time.  The urine was mildly blood stained.  There were no blood clots in the  urine.  It is planned to observe the patient closely.  He may need  platelet transfusion.  We will check his hemoglobin and hematocrit on a  q.6h. basis.  We will transfuse him as needed.      Dennie Maizes, [name]  Electronically Signed     SK/MEDQ  D:  09/01/2006  T:  09/01/2006  Job:  [id number]

## 2010-12-25 NOTE — Procedures (Signed)
   NAME:  BOBAK, OGUINN                          ACCOUNT NO.:  [id number]   MEDICAL RECORD NO.:  [id number]                   PATIENT TYPE:  SPE   LOCATION:  DFTL                                 FACILITY:  [facility]   PHYSICIAN:  [name], [name]               DATE OF BIRTH:  1930/08/18   DATE OF PROCEDURE:  DATE OF DISCHARGE:  09/10/2002                                EKG INTERPRETATION   INTERPRETATION:  EKG shows sinus tachycardia, no ST segment changes  indicative of ischemia.  Otherwise normal EKG.                                               [name], [name]    SAL/MEDQ  D:  10/22/2002  T:  10/22/2002  Job:  [id number]

## 2010-12-25 NOTE — Discharge Summary (Signed)
Stephen Martin, Stephen Martin                ACCOUNT NO.:  [id number]   MEDICAL RECORD NO.:  [id number]          PATIENT TYPE:  INP   LOCATION:  A227                          FACILITY:  [facility]   PHYSICIAN:  Dennie Maizes, [name]   DATE OF BIRTH:  02/07/1931   DATE OF ADMISSION:  2006-11-24  DATE OF DISCHARGE:  04/11/2008LH                               DISCHARGE SUMMARY   FINAL DIAGNOSES:  1. Hematuria.  2. Clot retention.  3. Anemia due to acute blood loss.  4. Bleeding diathesis due to dysfunctional platelets.  5. Thrombocytopenia.  6. Post transurethral resection of the prostate in January2008.   OPERATIVE PROCEDURE:  Cystoscopy and evacuation of blood clots done on  11/24/06.   COMPLICATIONS:  None.   DISCHARGE SUMMARY:  This 77 year old male is known to me from prior  evaluation surgery.  He had BPH with bladder neck obstruction,  associated urinary retention.  He was on long-term Foley catheter  drainage, was undergoing chemotherapy for lung cancer.  After completion  of the chemotherapy, the patient was taken to the operating room for  transurethral resection of prostate in January2008.  The patient had  significant postoperative bleeding which was thought to be secondary to  dysfunctional platelets and thrombocytopenia.  Multiple blood clots in  the bladder.  He was taken back to the OR for cystoscopy and clot  evacuation.  He had also had several blood transfusions.  He has been  admitted to hospital several occasions with recurrent bleeding.  The  patient has been doing well for three weeks prior to the recent  admission.   He started having hematuria on 11-24-06.  He had passed several  blood clots.  He was brought to the emergency room, and he was started  on IV fluids.  The Foley catheter was inserted in to the bladder and  continued bladder irrigation was started.  I did irrigation for about 30  minutes and removed large blood clots.  I still could not get all  the  blood clots out.  The patient was admitted to hospital.  Was taken to  the operating room for cystoscopy and evacuation of blood clots and  fulguration of the prostate.  He  received blood transfusions in the  emergency room.   PAST MEDICAL HISTORY:  1. BPH with bladder neck obstruction.  2. Status post TURP in January of 2008.  3. History of lung cancer status post chemotherapy.  4. Hypertension, urinary tract infection, dysfunctional platelets and      thrombocytopenia.   MEDICATIONS:  Xanax 0.24 grams p.o. b.i.d. p.r.n. anxiety.   ALLERGIES:  NONE.   PAST SURGICAL HISTORY:  1. Hernia surgery.  2. cardiac catheterization.  3. TURP IN 2008.   EXAMINATION:  GENERAL:  Revealed the patient to be comfortable at time  of examination.  VITAL SIGNS:  Blood pressure 100/70.  HEAD, EYES, EARS, NOSE and THROAT:  Normal.  NECK:  No masses.  LUNGS: Clear to auscultation.  HEART:  Regular rate and rhythm.  No murmurs.  ABDOMEN:  Soft.  No palpable  mass.  Bladder was distended.  Penis and  testes were normal.   COURSE IN THE HOSPITAL:  Admission labs:  WBC 12.4, hemoglobin 10.5,  hematocrit 31.2, platelets 196, BUN 19, creatinine 1.2.  The hemoglobin  was repeated and found to be 9.5 with a hematocrit of 28.2,  PT is 15,  PTT 13.5.  Bleeding time was prolonged 13.5 minutes.   As an emergency, the patient was taken to the operating room.  Spinal  anesthesia, cystoscopy and evacuation of blood clots were done.  There  was very minimal bleeding from the prostatic bed at the 6 o'clock  position.  Fulguration was done.  The patient also received 4 units of  packed platelets after the surgery.  Discontinued on bladder irrigations  and he did well.  Dr.Neijstrom was consulted for evaluation of his  bleeding diathesis.  The bleeding time, which was repeated on November 17, 2006 was 10 minutes.  PT/PTT within normal limits.  Postoperative day  one, his hemoglobin was 9.4, hematocrit 27.1.   He received two more  units of packed red blood cells on the first postoperative day.  The  second postoperative day, he was doing well.  Urine was clear after  discontinuing the bladder irrigation.  Hemoglobin 12.0, hematocrit 35.6,  platelet count 117,000.  The patient was discharged and then sent home  on November 18, 2006.  He was sent home with a Foley catheter.  He was on  Cipro and Percocet for pain.  He will be reviewed in the office in 2  weeks.  I plan to start the patient on Avodart to prevent the prostate  bleeding.  The patient was advised to call me for any fever, chills,  blocked catheter of recurrent hematuria.      Dennie Maizes, [name]  Electronically Signed     SK/MEDQ  D:  12/05/2006  T:  12/05/2006  Job:  [id number]   cc:   [name], MD  Fax: [phone]

## 2010-12-25 NOTE — Op Note (Signed)
[name], Stephen Martin                ACCOUNT NO.:  [id number]   MEDICAL RECORD NO.:  [id number]          PATIENT TYPE:  INP   LOCATION:  A227                          FACILITY:  [facility]   PHYSICIAN:  Dennie Maizes, [name]   DATE OF BIRTH:  21-Jul-1931   DATE OF PROCEDURE:  11/16/2006  DATE OF DISCHARGE:                               OPERATIVE REPORT   PREOPERATIVE DIAGNOSES:  1. Hematuria.  2. Clot retention.  3. Post transurethral resection of the prostate in January2008.   POSTOPERATIVE DIAGNOSES:  1. Hematuria.  2. Clot retention.  3. Post transurethral resection of the prostate in January2008.   OPERATIVE PROCEDURES:  1. Cystoscopy.  2. Evacuation of blood clots.  3. Fulguration of prostate.   ANESTHESIA:  Spinal.   SURGEON:  Dennie Maizes, [name]   COMPLICATIONS:  None.   ESTIMATED BLOOD LOSS:  100 mL.   DRAINS:  22-French triple lumen Foley catheter with 30 mL balloon in the  bladder.   INDICATIONS FOR PROCEDURE:  This 77 year old male with urinary retention  and BPH has undergone transverse resection of prostate in  January2008.  He has undergone chemotherapy for lung cancer.  He has  dysfunctional platelets.  He has been admitted to the hospital on  several occasions with clot retention and hematuria.  Clot evacuation  and fulguration of the prostate has been done x2 in the past.  The last  procedure was done about three weeks ago.  The patient presented to the  emergency room with clotted retention again today.  I was unable to get  the clots through the Foley catheter.  The patient's bleeding time was  elevated to 13.5 minutes.  He was taken to the operating room today for  cystoscopy, evacuation of blood clots and fulguration of the prostate.   DESCRIPTION OF PROCEDURE:  Spinal anesthesia was induced and the patient  was placed on the OR table in the dorsal lithotomy position.  The Foley  catheter was removed.  The lower abdomen and genitalia were prepped and  draped in a sterile fashion.  The urethra was dilated up to 30-French  with [name] sounds.  A 28 French [name] resectoscope with  continuous bladder irrigation was then inserted into the bladder.  Irrigation of the bladder was done in the [facility] evacuators and large  blood clots were removed.  The bladder and prostate were closely  examined.  There was no bleeding from the bladder.  There was mild  oozing from the prostatic bed at 6  o'clock position which was  fulgurated.  The patient also had some residual median lobe enlargement  protruding to the bladder.  In view of his elevated bleeding time, I  decided not to resect his prostate.  The patient has been voiding  without any difficulty.   Estimated blood loss for this procedure was about 100 mL.  The  instruments were removed.  A 22-French triple lumen Foley catheter with  30 mL balloon was inserted into the bladder.  Continuous bladder  irrigation was started and the returns were clear.  The patient was  transferred to the PACU in a satisfactory condition.      Dennie Maizes, [name]  Electronically Signed     SK/MEDQ  D:  11/16/2006  T:  11/16/2006  Job:  [id number]   cc:   [name], MD  Fax: [phone]   [name]. [name], MD  Fax: [phone]

## 2010-12-25 NOTE — Cardiovascular Report (Signed)
NAME:  Stephen Martin, Stephen Martin                          ACCOUNT NO.:  [id number]   MEDICAL RECORD NO.:  [id number]                   PATIENT TYPE:  INP   LOCATION:  4727                                 FACILITY:  [facility]   PHYSICIAN:  Vida Roller, [name]                DATE OF BIRTH:  08-Sep-1930   DATE OF PROCEDURE:  08/15/2002  DATE OF DISCHARGE:                              CARDIAC CATHETERIZATION   PRIMARY CARE PHYSICIAN:  [name], [name]   INDICATIONS FOR PROCEDURE:  Nonsustained ventricular tachycardia on an  exercise stress test.   HISTORY OF PRESENT ILLNESS:  The patient is a 77 year old gentleman with no  prior history of coronary disease, who presented on  10 August 2002, to [facility] [place]  complaining of epigastric pain and was found to have a significant duodenal  ulcer with a relatively significant upper GI bleed, which required  transfusion.  Subsequently, he had substernal chest discomfort, underwent an  exercise stress test, which revealed nonsustained ventricular tachycardia  and he was referred for elective heart catheterization.   DETAILS OF PROCEDURE:  After obtaining informed consent from my colleague,  Dr. Geralynn Rile, the patient was brought to the cardiac catheterization  laboratory in the fasting state.  There he was prepped and draped in the  usual sterile manner, and local anesthetic was obtained over the right groin  1% lidocaine without epinephrine.  The right femoral artery was cannulated  using the modified Seldinger technique with 6 French 10 cm sheath, and left  heart catheterization was performed using a 6 French Judkins left #4, a 6  French Judkins right #4, and a 6 [place] non torquing right catheter, which  actually seated the right coronary artery, as well as a straight pigtail  catheter.  The pigtail catheter was advanced under fluoroscopic guidance to  the ascending aorta and then prolapsed across the aortic  valve.  There  pressure tracings were obtained and then the catheter was connected to a  MedRight system where power injection was performed at a rate of 13 cubic  centimeter a second for a total of 39 cubic centimeter of dye at a half-  second rate of rise, 600 PSI.  At the conclusion of the left ventriculogram,  which was imaged in the 30-degree RAO view, the catheter was prolapsed  across the aortic valve under continuous pressure monitoring and then  removed from the patient.  A right femoral arteriogram was performed and the  right femoral arteriotomy was closed using a Perclose device.  At the  conclusion of the case, there was no evidence of ecchymosis or hematoma  formation and distal pulses were intact.  Total fluoroscopic time was 7.1  minutes.  Total iodized contrast used was 100 cubic centimeter of Omnipaque.   RESULTS:  Aortic pressure was measured at 122/66 with a mean arterial  pressure of 91 mmHg.  Left ventricular pressure was measured at 133/9 mmHg  with a end-diastolic pressure of 16 mmHg.   SELECTIVE CORONARY ANGIOGRAPHY:  1. The left main coronary artery is a normal sized artery and is     angiographically unremarkable.  2. The left anterior descending coronary artery is a moderate sized vessel     with a 50% stenosis in the proximal portion, which does not appear to be     obstructed, but does involve a first diagonal.  The remainder of the LAD     is a normal sized vessel with luminal irregularities.  3. The circumflex coronary artery is a moderate sized vessel with luminal     irregularities and two dominant obtuse marginals.  4. The right coronary artery is a large dominant vessel with a 25% stenosis     in the distal portion of the artery prior to the crux.  The PDA is a     normal caliber vessel with luminal irregularities.   LEFT VENTRICULOGRAPHY:  Left ventriculography reveals preserved left  ventricular ejection fraction with no evidence of wall motion  abnormalities  and no mitral regurgitation.   ASSESSMENT:  Our assessment is that this is a gentleman without  nonobstructive coronary disease and nonsustained ventricular tachycardia  with a normal left ventricular ejection fraction.   RECOMMENDATIONS:  Our recommendation is that he be placed on a beta blocker  and evaluated on an ongoing basis.                                                   Vida Roller, [name]    JH/MEDQ  D:  08/15/2002  T:  08/16/2002  Job:  [id number]   cc:   Salvadore Farber, [name] [facility]  [address] [place], [place] [address]  Fax: [phone]   [name], [name]  [address]  [place]  [place] [address]  Fax: [phone]

## 2011-01-07 ENCOUNTER — Emergency Department (HOSPITAL_COMMUNITY)
Admission: EM | Admit: 2011-01-07 | Discharge: 2011-01-07 | Disposition: A | Discharge status: Home / Self Care | Payer: Medicare Other | Attending: Emergency Medicine | Admitting: Emergency Medicine

## 2011-01-07 DIAGNOSIS — R112 Nausea with vomiting, unspecified: Secondary | ICD-10-CM | POA: Insufficient documentation

## 2011-01-07 DIAGNOSIS — I10 Essential (primary) hypertension: Secondary | ICD-10-CM | POA: Insufficient documentation

## 2011-01-07 DIAGNOSIS — R197 Diarrhea, unspecified: Secondary | ICD-10-CM | POA: Insufficient documentation

## 2011-01-07 LAB — URINALYSIS, ROUTINE W REFLEX MICROSCOPIC
Bilirubin Urine: NEGATIVE
Nitrite: NEGATIVE
Specific Gravity, Urine: 1.02 (ref 1.005–1.030)
Urobilinogen, UA: 1 mg/dL (ref 0.0–1.0)

## 2011-01-07 LAB — BASIC METABOLIC PANEL
Calcium: 9.2 mg/dL (ref 8.4–10.5)
GFR calc Af Amer: >60 mL/min (ref >60)
GFR calc non Af Amer: >60 mL/min (ref >60)
Sodium: 130 mEq/L — ABNORMAL LOW (ref 135–145)

## 2011-01-07 LAB — URINE MICROSCOPIC-ADD ON

## 2011-01-07 LAB — DIFFERENTIAL
Basophils Absolute: 0 10*3/uL (ref 0.0–0.1)
Basophils Relative: 0 % (ref 0–1)
Eosinophils Relative: 1 % (ref 0–5)
Monocytes Absolute: 0.5 10*3/uL (ref 0.1–1.0)

## 2011-01-07 LAB — CBC
MCHC: 33.2 g/dL (ref 30.0–36.0)
Platelets: 114 10*3/uL — ABNORMAL LOW (ref 150–400)
RDW: 14 % (ref 11.5–15.5)

## 2011-01-08 ENCOUNTER — Emergency Department (HOSPITAL_COMMUNITY): Payer: Medicare Other

## 2011-01-08 ENCOUNTER — Inpatient Hospital Stay (HOSPITAL_COMMUNITY)
Admission: EM | Admit: 2011-01-08 | Discharge: 2011-01-09 | Disposition: A | Discharge status: Home / Self Care | Payer: Medicare Other | Source: Home / Self Care

## 2011-01-08 DIAGNOSIS — I2589 Other forms of chronic ischemic heart disease: Secondary | ICD-10-CM | POA: Diagnosis present

## 2011-01-08 DIAGNOSIS — I319 Disease of pericardium, unspecified: Secondary | ICD-10-CM | POA: Diagnosis present

## 2011-01-08 DIAGNOSIS — A7740 Ehrlichiosis, unspecified: Principal | ICD-10-CM | POA: Diagnosis present

## 2011-01-08 DIAGNOSIS — N419 Inflammatory disease of prostate, unspecified: Secondary | ICD-10-CM | POA: Diagnosis present

## 2011-01-08 DIAGNOSIS — I10 Essential (primary) hypertension: Secondary | ICD-10-CM | POA: Diagnosis present

## 2011-01-08 DIAGNOSIS — N390 Urinary tract infection, site not specified: Secondary | ICD-10-CM | POA: Diagnosis present

## 2011-01-08 DIAGNOSIS — I4891 Unspecified atrial fibrillation: Secondary | ICD-10-CM | POA: Diagnosis present

## 2011-01-08 DIAGNOSIS — D696 Thrombocytopenia, unspecified: Secondary | ICD-10-CM | POA: Diagnosis present

## 2011-01-08 DIAGNOSIS — I498 Other specified cardiac arrhythmias: Secondary | ICD-10-CM | POA: Diagnosis present

## 2011-01-08 DIAGNOSIS — I2510 Atherosclerotic heart disease of native coronary artery without angina pectoris: Secondary | ICD-10-CM | POA: Diagnosis present

## 2011-01-08 DIAGNOSIS — Z85118 Personal history of other malignant neoplasm of bronchus and lung: Secondary | ICD-10-CM

## 2011-01-08 DIAGNOSIS — Z452 Encounter for adjustment and management of vascular access device: Secondary | ICD-10-CM

## 2011-01-08 DIAGNOSIS — I5189 Other ill-defined heart diseases: Secondary | ICD-10-CM | POA: Diagnosis present

## 2011-01-08 DIAGNOSIS — I517 Cardiomegaly: Secondary | ICD-10-CM | POA: Diagnosis present

## 2011-01-08 LAB — URINALYSIS, ROUTINE W REFLEX MICROSCOPIC
Bilirubin Urine: NEGATIVE
Specific Gravity, Urine: 1.02 (ref 1.005–1.030)
Urobilinogen, UA: 0.2 mg/dL (ref 0.0–1.0)

## 2011-01-08 LAB — DIFFERENTIAL
Basophils Absolute: 0 10*3/uL (ref 0.0–0.1)
Basophils Relative: 1 % (ref 0–1)
Monocytes Relative: 7 % (ref 3–12)
Neutro Abs: 3.6 10*3/uL (ref 1.7–7.7)
Neutrophils Relative %: 75 % (ref 43–77)

## 2011-01-08 LAB — COMPREHENSIVE METABOLIC PANEL
ALT: 28 U/L (ref 0–53)
AST: 34 U/L (ref 0–37)
CO2: 28 mEq/L (ref 19–32)
Chloride: 98 mEq/L (ref 96–112)
GFR calc Af Amer: >60 mL/min (ref >60)
GFR calc non Af Amer: >60 mL/min (ref >60)
Potassium: 3.7 mEq/L (ref 3.5–5.1)
Sodium: 133 mEq/L — ABNORMAL LOW (ref 135–145)
Total Bilirubin: 0.6 mg/dL (ref 0.3–1.2)

## 2011-01-08 LAB — CBC
Hemoglobin: 14.1 g/dL (ref 13.0–17.0)
MCH: 27.5 pg (ref 26.0–34.0)
RBC: 5.13 MIL/uL (ref 4.22–5.81)
WBC: 4.9 10*3/uL (ref 4.0–10.5)

## 2011-01-08 LAB — URINE MICROSCOPIC-ADD ON

## 2011-01-09 LAB — DIFFERENTIAL
Basophils Absolute: 0 10*3/uL (ref 0.0–0.1)
Eosinophils Absolute: 0.1 10*3/uL (ref 0.0–0.7)
Eosinophils Relative: 2 % (ref 0–5)
Monocytes Absolute: 0.4 10*3/uL (ref 0.1–1.0)

## 2011-01-09 LAB — CBC
MCHC: 32.6 g/dL (ref 30.0–36.0)
MCV: 86.9 fL (ref 78.0–100.0)
Platelets: 82 10*3/uL — ABNORMAL LOW (ref 150–400)
RDW: 14.2 % (ref 11.5–15.5)
WBC: 4.1 10*3/uL (ref 4.0–10.5)

## 2011-01-09 LAB — BASIC METABOLIC PANEL
BUN: 12 mg/dL (ref 6–23)
CO2: 25 mEq/L (ref 19–32)
Chloride: 100 mEq/L (ref 96–112)
Creatinine, Ser: 1 mg/dL (ref 0.4–1.5)
GFR calc Af Amer: >60 mL/min (ref >60)
Potassium: 3.7 mEq/L (ref 3.5–5.1)

## 2011-01-09 NOTE — Discharge Summary (Signed)
Stephen Martin, Stephen Martin                ACCOUNT NO.:  [id number]  MEDICAL RECORD NO.:  [id number]           PATIENT TYPE:  I  LOCATION:  A306                          FACILITY:  [facility]  PHYSICIAN:  Wilson Singer, M.D.DATE OF BIRTH:  03/05/1931  DATE OF ADMISSION:  01/08/2011 DATE OF DISCHARGE:  LH                              DISCHARGE SUMMARY   FINAL DISCHARGE DIAGNOSES: 1. Prostatitis. 2. Possible urinary tract infection.  CONDITION ON DISCHARGE:  Stable.  MEDICATIONS ON DISCHARGE:  The patient will continue home medications, oxybutynin 5 mg a day and also finished the whole course of ciprofloxacin which he had been given from the emergency room for 1 week.  HISTORY:  This very pleasant 77 year old man was admitted because of fever and chills.  The day prior to that he had visit to the emergency room with nausea, vomiting and diarrhea and he was felt to have a urinary tract infection and was given intravenous Rocephin and a prescriptions for ciprofloxacin.  Please see initial history and physical examination done by Dr. Vedia Coffer.  HOSPITAL PROGRESS:  The patient was admitted last night and today he feels perfectly well with no symptoms of fever, chills.  He denies any pain on micturition or increased frequency.  Certainly, there is no nausea, vomiting or diarrhea or abdominal pain now.  PHYSICAL EXAMINATION:  VITAL SIGNS:  Temperature 99.6, blood pressure 142/86, pulse 100, saturation 94% on room air. GENERAL:  He does actually looks systemically well and alert and orientated. CARDIAC:  Heart sounds are present and normal. LUNGS:  Lung fields are clear with some coarse crackles in the right mid and lower zone which I think are really chronic based on his chest x-ray findings. ABDOMEN:  Soft and nontender. NEUROLOGICAL:  Alert and oriented without any focal neurologic signs.  INVESTIGATIONS:  Sodium 133, potassium 3.7, bicarbonate 25, BUN 12, creatinine 1.0.   Hemoglobin 13.6, white blood cell count normal at 4.1, platelets 82 which I believe is really likely to be a chronic finding. This is actually on reflection little bit reduced than previous platelet count.  Day before yesterday, his platelet was 114 still on the lower side.  This will need to be monitored.  Urinalysis in the emergency room had shown trace leukocytes, moderate amount of blood and microscopy showed a few bacteria.  Yesterday's, urinalysis showed a moderate amount of blood again and trace leukocytes again with rare bacteria.  DISPOSITION:  I really feel he is stable to be discharged home today, but he clearly needs followup on his urine and hematuria that we have seen.  He may well need urological consultation if this does not clear up.  Urine has been sent for culture and we do not have the results at the present time.  I know that he has seen Dr. Rito Ehrlich in 2008, for hematuria and maybe he needs to see him again.     Wilson Singer, [name]     NCG/MEDQ  D:  01/09/2011  T:  01/09/2011  Job:  [id number]  cc:   [name], MD Fax: [phone]  Electronically Signed  by Lilly Cove [name] on 01/09/2011 02:15:51 PM

## 2011-01-10 ENCOUNTER — Inpatient Hospital Stay (HOSPITAL_COMMUNITY): Payer: Medicare Other

## 2011-01-10 ENCOUNTER — Emergency Department (HOSPITAL_COMMUNITY): Payer: Medicare Other

## 2011-01-10 ENCOUNTER — Inpatient Hospital Stay (HOSPITAL_COMMUNITY)
Admission: EM | Admit: 2011-01-10 | Discharge: 2011-01-16 | DRG: 868 | Disposition: A | Discharge status: Home Health | Payer: Medicare Other | Attending: Internal Medicine | Admitting: Internal Medicine

## 2011-01-10 LAB — COMPREHENSIVE METABOLIC PANEL
ALT: 46 U/L (ref 0–53)
Alkaline Phosphatase: 147 U/L — ABNORMAL HIGH (ref 39–117)
BUN: 14 mg/dL (ref 6–23)
Chloride: 93 mEq/L — ABNORMAL LOW (ref 96–112)
Glucose, Bld: 136 mg/dL — ABNORMAL HIGH (ref 70–99)
Potassium: 3.6 mEq/L (ref 3.5–5.1)
Sodium: 127 mEq/L — ABNORMAL LOW (ref 135–145)
Total Bilirubin: 0.8 mg/dL (ref 0.3–1.2)
Total Protein: 6.7 g/dL (ref 6.0–8.3)

## 2011-01-10 LAB — URINALYSIS, ROUTINE W REFLEX MICROSCOPIC
Glucose, UA: NEGATIVE mg/dL
Leukocytes, UA: NEGATIVE
Protein, ur: 30 mg/dL — AB
pH: 6 (ref 5.0–8.0)

## 2011-01-10 LAB — CBC
MCV: 86.1 fL (ref 78.0–100.0)
Platelets: 69 10*3/uL — ABNORMAL LOW (ref 150–400)
RBC: 4.9 MIL/uL (ref 4.22–5.81)
RDW: 14.2 % (ref 11.5–15.5)
WBC: 4.9 10*3/uL (ref 4.0–10.5)

## 2011-01-10 LAB — URINE MICROSCOPIC-ADD ON

## 2011-01-10 LAB — DIFFERENTIAL
Basophils Absolute: 0 10*3/uL (ref 0.0–0.1)
Basophils Relative: 0 % (ref 0–1)
Eosinophils Absolute: 0 10*3/uL (ref 0.0–0.7)
Eosinophils Relative: 0 % (ref 0–5)
Lymphs Abs: 0.3 10*3/uL — ABNORMAL LOW (ref 0.7–4.0)
Neutrophils Relative %: 88 % — ABNORMAL HIGH (ref 43–77)

## 2011-01-10 LAB — URINE CULTURE
Colony Count: 5000
Culture  Setup Time: 201206022019

## 2011-01-10 LAB — PRO B NATRIURETIC PEPTIDE: Pro B Natriuretic peptide (BNP): 751.8 pg/mL — ABNORMAL HIGH (ref 0–450)

## 2011-01-10 LAB — LACTIC ACID, PLASMA: Lactic Acid, Venous: 1.7 mmol/L (ref 0.5–2.2)

## 2011-01-10 LAB — PROCALCITONIN: Procalcitonin: 1.17 ng/mL

## 2011-01-10 MED ORDER — IOHEXOL 300 MG/ML  SOLN
100.0000 mL | Freq: Once | INTRAMUSCULAR | Status: AC | PRN
  Administered 2011-01-10: 100 mL via INTRAVENOUS

## 2011-01-10 NOTE — H&P (Signed)
NAMEGEARALD, Stephen Martin                ACCOUNT NO.:  [id number]  MEDICAL RECORD NO.:  [id number]           PATIENT TYPE:  I  LOCATION:  A306                          FACILITY:  [facility]  PHYSICIAN:  [name], [name] DATE OF BIRTH:  August 12, 1930  DATE OF ADMISSION:  01/08/2011 DATE OF DISCHARGE:  LH                             HISTORY & PHYSICAL   PRIMARY CARE PHYSICIAN:  [name], MD  CHIEF COMPLAINT:  Fever and chills.  HISTORY OF PRESENT ILLNESS:  This is a 76 year old Caucasian gentleman who presented to the emergency room on Wednesday complaining of nausea, vomiting and diarrhea.  He was evaluated, felt to be having a urinary tract infection, given an injection of Rocephin and a prescription for Cipro.  The patient said he did not leave the emergency room until early in the morning and did not fill the prescription until the day of representation when he started to feel weak, having chills and decided to return to the emergency room for reevaluation.  The patient reports of the nausea, vomiting and diarrhea, now stopped .  He was evaluated by the emergency room physician who did a rectal exam and felt that his prostate was tender and recommended admission because of his possible SIRS.  The patient for a long time declined admission but eventually decided he wanted to be admitted.  He has been having no frequency nor dysuria.  He has been having no back pains.  He denies any further nausea, vomiting or diarrhea.  He denies bloody or black stool.  He denies headache.  PAST MEDICAL HISTORY: 1. Remote history of lung cancer. 2. History of prostate cancer, status post TURP. 3. Hypertension. 4. History of head and neck cancer, status post surgery to mandible     and temporal area. 5. Status post multiple skin cancers of the head and neck, currently     getting treatment by dermatologist in [place]. 6. Medications oxybutynin 5 mg daily. 7. Taken no other chronic  medications but recently was given Zofran     and Cipro at his recent hospital visit.  ALLERGIES:  No known drug allergies.  SOCIAL HISTORY:  Denies tobacco, alcohol or illicit drug use.  He is a retired Electrical engineer.  FAMILY HISTORY:  Denies any significant family medical history.  REVIEW OF SYSTEMS:  Other than noted above unremarkable.  PHYSICAL EXAMINATION:  GENERAL:  Elderly Caucasian gentleman lying in a stretcher, not currently in acute distress. VITALS:  His temperature was 102.1 per rectum, his pulse 118, respiration 20, blood pressure 128/75.  He is saturating at 92% on room air. HEENT:  Pupils are round and equal.  Mucous membranes pink.  Sclerae anicteric.  No cervical lymphadenopathy.  He has a deformity of the left side of his face, status post head and neck surgery.  No thyromegaly. No carotid bruits. CHEST:  Clear to auscultation bilaterally. CARDIOVASCULAR:  Regular rhythm.  No murmur. ABDOMEN:  Soft, nontender. EXTREMITIES:  Without edema. CENTRAL NERVOUS SYSTEM:  Cranial nerves II-XII grossly intact.  He has no focal lateralizing signs. SKIN:  The skin of his face is erythematous  with multiple lesions where he has been getting treated for skin cancers.  LABORATORY DATA:  His white count is 4.9, hemoglobin 14.1, platelets 104 which is close to his baseline.  His sodium is 133, potassium 3.7, chloride 98, CO2 28, glucose 101, BUN 14, creatinine 1.1.  His albumin is 3.0, his calcium is 9.1.  His liver functions are otherwise unremarkable.  Urinalysis shows 0-2 white count, 3-6 red cells, otherwise unremarkable.  Two-view chest x-ray shows no acute process. Port-A-Cath remains in situ.  ASSESSMENT: 1. Systemic inflammatory response syndrome of unclear etiology,     possibly urinary tract infection, partially treated. 2. Hypertension,  controlled on diet. 3. History of benign prostatic hypertrophy.  PLAN: 1. We will bring this gentleman on observation for  hydration and     continuation of antibiotics therapy.  If he stabilizes, he can     likely be discharged home to continue outpatient antibiotics     therapy. 2. Other plans as per orders     [name], [name]     LC/MEDQ  D:  01/09/2011  T:  01/09/2011  Job:  [id number]  cc:   [name], MD Fax: [phone]  Electronically Signed by [name] [name] on 01/10/2011 11:91:47 AM

## 2011-01-11 DIAGNOSIS — C3490 Malignant neoplasm of unspecified part of unspecified bronchus or lung: Secondary | ICD-10-CM

## 2011-01-11 DIAGNOSIS — I319 Disease of pericardium, unspecified: Secondary | ICD-10-CM

## 2011-01-11 LAB — DIFFERENTIAL
Basophils Absolute: 0 10*3/uL (ref 0.0–0.1)
Basophils Relative: 1 % (ref 0–1)
Eosinophils Relative: 1 % (ref 0–5)
Monocytes Absolute: 0.3 10*3/uL (ref 0.1–1.0)
Neutro Abs: 2.9 10*3/uL (ref 1.7–7.7)

## 2011-01-11 LAB — ROCKY MTN SPOTTED FVR AB, IGG-BLOOD: RMSF IgG: 0.81 IV — ABNORMAL HIGH

## 2011-01-11 LAB — BASIC METABOLIC PANEL
Calcium: 8.6 mg/dL (ref 8.4–10.5)
GFR calc Af Amer: >60 mL/min (ref >60)
GFR calc non Af Amer: >60 mL/min (ref >60)
Potassium: 4.3 mEq/L (ref 3.5–5.1)
Sodium: 132 mEq/L — ABNORMAL LOW (ref 135–145)

## 2011-01-11 LAB — MRSA PCR SCREENING: MRSA by PCR: NEGATIVE

## 2011-01-11 LAB — CBC
MCHC: 31.6 g/dL (ref 30.0–36.0)
RDW: 14.5 % (ref 11.5–15.5)
WBC: 3.9 10*3/uL — ABNORMAL LOW (ref 4.0–10.5)

## 2011-01-11 LAB — T4, FREE: Free T4: 0.91 ng/dL (ref 0.80–1.80)

## 2011-01-11 LAB — DIC (DISSEMINATED INTRAVASCULAR COAGULATION)PANEL
D-Dimer, Quant: 2.43 ug/mL-FEU — ABNORMAL HIGH (ref 0.00–0.48)
aPTT: 51 seconds — ABNORMAL HIGH (ref 24–37)

## 2011-01-11 LAB — ROCKY MTN SPOTTED FVR AB, IGM-BLOOD: RMSF IgM: 0.19 IV (ref 0.00–0.89)

## 2011-01-11 LAB — MAGNESIUM: Magnesium: 1.7 mg/dL (ref 1.5–2.5)

## 2011-01-11 NOTE — Group Therapy Note (Signed)
  NAME:  Stephen Martin, Stephen Martin                ACCOUNT NO.:  [id number]  MEDICAL RECORD NO.:  [id number]           PATIENT TYPE:  I  LOCATION:  IC07                          FACILITY:  [facility]  PHYSICIAN:  Wilson Singer, M.D.DATE OF BIRTH:  1930-11-04  DATE OF PROCEDURE:  01/11/2011 DATE OF DISCHARGE:                                PROGRESS NOTE   HISTORY:  This man was admitted again yesterday after just being discharged a couple of days ago.  He presented with similar symptoms of fever, chills, lightheadedness for few days.  He had gone home and got the symptoms again.  On his last admission, a urine culture dated June 1 really did not show any significant UTI.  He had a CT angiogram of the chest done yesterday which showed a small left pleural effusion and also a stable pericardial effusion.  I note that he was very tachycardic last night also.  He himself feels better than yesterday after fluids and he has received also antibiotics.  He also tells me he has had his Port-A- Cath in situ for 8 years.  PHYSICAL EXAMINATION:  VITAL SIGNS:  Temperature 100.2, blood pressure 157/81, pulse 120 in sinus rhythm, and saturation 98% on 2 liters oxygen. HEART:  Sounds are present and appear to be in sinus rhythm. LUNGS:  Some scattered areas of bronchial breathing and I suspect this is atelectasis.  He does not clinically look septic at this point. ABDOMEN:  Soft and nontender.  No masses felt. NEUROLOGICAL:  He is alert and oriented without any focal neurologic signs.  INVESTIGATIONS:  Sodium 132, potassium 4.3, bicarbonate 28, BUN 12, creatinine 1.05, hemoglobin 13.4, white blood cell count reduced to 3.9 with a platelet count of 64.  IMPRESSION: 1. Systemic inflammatory response syndrome, unclear etiology. 2. Paroxysmal atrial flutter. 3. Pericardial effusion. 4. Left pleural effusion. 5. Thrombocytopenia. 6. History of lung cancer.  PLAN: 1. Continue antibiotics for the time being and  I see that he is on a     combination of Levaquin, doxycycline, and Maxipime. 2. Echocardiogram to further assess the pericardial effusion. 3. Oncology consultation and the question that I will ask him is     whether this mass Port-A-Cath needs to remain in situ because I     wonder if this is the site of entry of infection now rather than     what previously was considered which he was UTI and/or prostatitis.     Wilson Singer, [name]     NCG/MEDQ  D:  01/11/2011  T:  01/11/2011  Job:  [id number]  Electronically Signed by Lilly Cove [name] on 01/11/2011 12:10:00 PM

## 2011-01-11 NOTE — H&P (Signed)
Stephen Martin, FLEET                ACCOUNT NO.:  [id number]  MEDICAL RECORD NO.:  [id number]           PATIENT TYPE:  I  LOCATION:  IC07                          FACILITY:  [facility]  PHYSICIAN:  Stephen Martin, [name] DATE OF BIRTH:  [birth year]  DATE OF ADMISSION:  01/10/2011 DATE OF DISCHARGE:  LH                             HISTORY & PHYSICAL   PRIMARY CARE PHYSICIAN:  [name], MD  CARDIOLOGIST:  Gerrit Friends. Dietrich Pates, MD, [facility]  DICTATING PHYSICIAN:  Stephen Rea, MD  CHIEF COMPLAINT:  Recurrent fever, chills, and dizziness for the past 4 days.  HISTORY OF PRESENT ILLNESS:  This is a 77 year old Caucasian gentleman with a remote history of stage III non-small cell lung cancer status post radiation therapy and chemotherapy also status post multiple squamous skin cancer status post excision of the left parotid, and currently undergoing cauterization of other skin cancers of the face.  He is also status post complicated TURP for benign prostatic hypertrophy presented to the emergency room 4 days ago with a history of nausea, vomiting, and diarrhea, was evaluated, felt to be having a urinary tract infection, given a dose of Rocephin and discharged home with a prescription for Cipro.  Before patient get his prescription filled, he returned to the emergency room with fever and chills, he was looking quite ill in the emergency room, received intravenous fluids and was admitted by the Hospitalist Service.  With fluids and continued antibiotics therapy, the patient stabilized completely.  Yesterday morning, the patient reported that he was back to his normal self and he was discharged home to continue oral antibiotics and his usual medications.  However since being at home, the patient has been having fever and chills and having near syncopal episodes and had one episode of vomiting, however, no further diarrhea.  He returned to the emergency room to be evaluated today and was  noted to have a heart rate initially in 120s which went into the 180s which stabilized somewhat with dose of intravenous metoprolol.  Currently, he remains in sinus tachycardia with a heart rate in the 120s and reports feeling quite sick.  His white count remains normal, however, his platelets are noted to fall and the Hospitalist Service was called once again to assist with management.  In the emergency room, a chest x-ray was done which showed multiple focal abnormalities, questionable focal pulmonary edema, less likely to be pneumonia.  The patient denies any shortness of breath.  He reluctantly admits to possibly cough occasionally producing green sputum, but did not feel it was purulent.  Additionally, now the patient reports a few days ago he sustained three tick bites and wonder if his fever has anything to do with this problem. He denies any chest pains or shortness of breath.  He denies any nausea or he did have one episode of vomiting.  He denies any bloody or black stool.  He denies any blood in the vomitus.  He denies any headache.  PAST MEDICAL HISTORY: 1. Lung cancer in 2007, status post radiation therapy and     chemotherapy. 2. History of benign prostatic hypertrophy  status post TURP.  Please     note the patient does not have a history of prostate cancer. 3. 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 4.  Multiple skin cancers of the head and neck.  Currently     getting treatment by dermatologist in [place]. 5. History of peptic ulcer disease status post remote GI bleed. 6. Remote episode of atrial fibrillation, postoperative.  MEDICATIONS:  Include oxybutynin daily;   Episodic antibiotic therapy past 3 days at Hospital   ALLERGIES:  No known drug allergies.  SOCIAL HISTORY:  Denies tobacco, alcohol, or illicit  drug use.  He is a retired Electrical engineer.  Denies any significant family medical history.  REVIEW OF SYSTEMS:  Other than noted above unremarkable.  PHYSICAL EXAMINATION:  GENERAL:  Elderly Caucasian gentleman lying in the stretcher, currently not ill looking. VITAL SIGNS:  Temperature is 100.6, his blood pressure initially 94/50, currently 117/74, his heart rate initially 128, went to as high as 186, currently 120, respiratory rate 20, he is saturating at 96% on 2 liters. HEENT:  His pupils are round and equal.  Mucous membranes pink. Anicteric.  No cervical lymphadenopathy. CHEST:  Clear to auscultation bilaterally. CARDIOVASCULAR SYSTEM:  Tachycardic.  No murmur heard. ABDOMEN:  Soft and nontender. EXTREMITIES:  Without edema.  LABORATORY DATA: 1. His white count is 4.9, hemoglobin 13.6, platelets 69, trending     down from 82 yesterday and 114 days earlier, he has 88%     neutrophils, and absolute granulocyte count of 4.3.  His lymphocyte     count is low at 0.3.  Differential is otherwise unremarkable.  His     sodium is low at 127, potassium 3.6, chloride 93, CO2 of 26,     glucose 136, BUN 14, creatinine 1.0, alk phos is 147, AST 63, and     albumin is 2.9, calcium 8.9.  Liver functions otherwise     unremarkable.  His lactic acid is normal at 1.7.  Procalcitonin     equivalent at 1.17.  Urine culture done days earlier shows     insignificant growth.  Portable chest x-ray shows low-volume chest     with bilateral basilar airspace disease suspicious for either     multifocal pneumonia or atypical pulmonary edema, this is all     superimposed on pulmonary parenchymal scarring. 2. Subsequent CT angiogram of the chest was determined as not being     adequate to assess for pulmonary embolus showed no evidence of     pneumonia, no evidence of pulmonary edema, did show multiple     chronic lung changes including chronic scarring, chronic unchanged     pericardial effusion.  It did  show only new finding was worsening     left pleural effusion.  Of note, his urinalysis today showed clear     yellow urine, negative nitrites or leukocyte esterase, moderate     amount of hemoglobin, 30 proteins.  Microscopy did shows 7-10 white     cells, 11-20 red cells, but no bacteria.  EKG when the patient's     heart rate was 177, showed atrial flutter with 2:1 AV block.  ASSESSMENT: 1. Systemic inflammatory response syndrome, questionable related to     viral or rickettsial etiology, given the patient's thrombocytopenia  and history of tick bites questionable related to partially treated     urinary tract infection. 2. Paroxysmal atrial flutter, probably secondary to acute illness. 3. Nausea and vomiting, resolved. 4. Left pleural effusion of unclear etiology, questionable partially     treated pneumonia, although, this does not appear to be supported     by CT angiogram; questionable malignant effusion 5. Pulmonary embolus remains a possibility. 6. Thrombocytopenia  PLAN:  We will admit this gentleman to the critical care unit for hydration and continue broad-spectrum antibiotics including doxycycline and await the results of further infectious workup other plans as per orders. Will monitor platelet count and may have to discontinue Lovenox in invsetigate if it continues to fall.     Stephen Martin, [name]     LC/MEDQ  D:  01/11/2011  T:  01/11/2011  Job:  [id number]  cc:   [name], MD Fax: [phone]  Electronically Signed by Stephen Martin [name] on 01/11/2011 04:21:39 AM

## 2011-01-12 DIAGNOSIS — C3490 Malignant neoplasm of unspecified part of unspecified bronchus or lung: Secondary | ICD-10-CM

## 2011-01-12 LAB — COMPREHENSIVE METABOLIC PANEL
AST: 52 U/L — ABNORMAL HIGH (ref 0–37)
Albumin: 2.3 g/dL — ABNORMAL LOW (ref 3.5–5.2)
BUN: 12 mg/dL (ref 6–23)
Chloride: 101 mEq/L (ref 96–112)
Creatinine, Ser: 0.83 mg/dL (ref 0.4–1.5)
GFR calc Af Amer: >60 mL/min (ref >60)
Potassium: 4 mEq/L (ref 3.5–5.1)
Total Bilirubin: 0.4 mg/dL (ref 0.3–1.2)
Total Protein: 5.6 g/dL — ABNORMAL LOW (ref 6.0–8.3)

## 2011-01-12 LAB — CBC
Hemoglobin: 12.8 g/dL — ABNORMAL LOW (ref 13.0–17.0)
MCHC: 32.8 g/dL (ref 30.0–36.0)
Platelets: 61 10*3/uL — ABNORMAL LOW (ref 150–400)
RBC: 4.55 MIL/uL (ref 4.22–5.81)

## 2011-01-12 LAB — DIFFERENTIAL
Basophils Absolute: 0.1 10*3/uL (ref 0.0–0.1)
Basophils Relative: 3 % — ABNORMAL HIGH (ref 0–1)
Lymphocytes Relative: 37 % (ref 12–46)
Neutro Abs: 1.8 10*3/uL (ref 1.7–7.7)
Neutrophils Relative %: 49 % (ref 43–77)

## 2011-01-12 LAB — URINE CULTURE
Colony Count: NO GROWTH
Culture  Setup Time: 201206032135

## 2011-01-12 LAB — PROTIME-INR
INR: 1.06 (ref 0.00–1.49)
Prothrombin Time: 14 seconds (ref 11.6–15.2)

## 2011-01-12 LAB — GLUCOSE, CAPILLARY

## 2011-01-12 LAB — SEDIMENTATION RATE: Sed Rate: 7 mm/hr (ref 0–16)

## 2011-01-12 NOTE — Group Therapy Note (Signed)
  NAMEPAYDON, Stephen Martin                ACCOUNT NO.:  [id number]  MEDICAL RECORD NO.:  [id number]  LOCATION:  IC07                          FACILITY:  [facility]  PHYSICIAN:  Wilson Singer, M.D.DATE OF BIRTH:  Aug 13, 1930  DATE OF PROCEDURE:  01/12/2011 DATE OF DISCHARGE:                                PROGRESS NOTE   HISTORY OF PRESENT ILLNESS:  This man feels better overall.  He has had episodes of significant sinus tachycardia going into 160s and 170s.  He does not appear to have had any further fevers overnight.  Dr. Malvin Johns was kind enough to see this patient yesterday and he will remove the Port-A-Cath today.  This was agreed to by Oncology also.  PHYSICAL EXAMINATION:  VITAL SIGNS:  Temperature 98.7, blood pressure 153/89, pulse 99, saturation 94% on 2 L oxygen.  HEART:  Sounds are present and tachycardic.  Lung:  Fields are clear.  Jugular venous pressure is not raised.  INVESTIGATIONS:  Hemoglobin 12.8, white blood cell count 3.8, platelets61.  INR 1.06.  TSH slightly increased to 8.21 with a free T4 of 0.91. ESR only 7.  Sodium 132, potassium 4.0, bicarbonate 22, BUN 12, creatinine 0.83.  Liver enzymes marginally raised with AST of 52, alkaline phosphatase 143, albumin is 2.3.  He did have an echocardiogram yesterday and this was done because of the possibility of a pericardial effusion.  Surprisingly, the echocardiogram shows that there is mild LVH, but more importantly systolic function was severely reduced with an ejection fraction of 30-35%.  There was severe hypokinesis to almost akinesis of multiple segments described.  There was also a small apical thrombus associated with an akinetic segment.  As far as the pericardial effusion is concerned, there is a minimal free-flowing pericardial effusion identified in the posterior to the heart.  There was also a small and partially organized effusion anterior to the right ventricle. There was no evidence of tamponade  whatsoever.  IMPRESSION: 1. Systemic inflammatory response syndrome, unclear etiology, but     clinically improving. 2. Systolic dysfunction, ejection fraction 30-35%. 3. Small pericardial effusion without tamponade. 4. Thrombocytopenia. 5. History of lung cancer.  PLAN: 1. Cardiology consultation in view of the new finding of reduced     systolic function. 2. Reduce IV fluids. 3. Start beta-blocker and ACE inhibitor 4. Continue IV antibiotics. 5. Removal of Port-A-Cath today.     Wilson Singer, [name]     NCG/MEDQ  D:  01/12/2011  T:  01/12/2011  Job:  [id number]  Electronically Signed by Lilly Cove [name] on 01/12/2011 78:29:56 PM

## 2011-01-13 ENCOUNTER — Inpatient Hospital Stay (HOSPITAL_COMMUNITY): Payer: Medicare Other

## 2011-01-13 DIAGNOSIS — I4892 Unspecified atrial flutter: Secondary | ICD-10-CM

## 2011-01-13 DIAGNOSIS — I429 Cardiomyopathy, unspecified: Secondary | ICD-10-CM

## 2011-01-13 LAB — HEPARIN INDUCED THROMBOCYTOPENIA PNL
Heparin Induced Plt Ab: NEGATIVE
UFH SRA Result: NEGATIVE

## 2011-01-13 LAB — DIFFERENTIAL
Basophils Absolute: 0.1 10*3/uL (ref 0.0–0.1)
Basophils Relative: 2 % — ABNORMAL HIGH (ref 0–1)
Eosinophils Absolute: 0.3 10*3/uL (ref 0.0–0.7)
Eosinophils Relative: 5 % (ref 0–5)
Monocytes Absolute: 0.3 10*3/uL (ref 0.1–1.0)
Monocytes Relative: 5 % (ref 3–12)
Neutro Abs: 2.3 10*3/uL (ref 1.7–7.7)

## 2011-01-13 LAB — CULTURE, BLOOD (ROUTINE X 2): Culture: NO GROWTH

## 2011-01-13 LAB — COMPREHENSIVE METABOLIC PANEL
BUN: 11 mg/dL (ref 6–23)
CO2: 26 mEq/L (ref 19–32)
Calcium: 8.7 mg/dL (ref 8.4–10.5)
Chloride: 100 mEq/L (ref 96–112)
Creatinine, Ser: 0.7 mg/dL (ref 0.4–1.5)
GFR calc Af Amer: >60 mL/min (ref >60)
GFR calc non Af Amer: >60 mL/min (ref >60)
Glucose, Bld: 103 mg/dL — ABNORMAL HIGH (ref 70–99)
Total Bilirubin: 0.3 mg/dL (ref 0.3–1.2)

## 2011-01-13 LAB — CBC
Hemoglobin: 12.5 g/dL — ABNORMAL LOW (ref 13.0–17.0)
MCH: 27.7 pg (ref 26.0–34.0)
MCHC: 32.6 g/dL (ref 30.0–36.0)
RDW: 14.6 % (ref 11.5–15.5)

## 2011-01-13 MED ORDER — IOHEXOL 350 MG/ML SOLN
100.0000 mL | Freq: Once | INTRAVENOUS | Status: AC | PRN
  Administered 2011-01-13: 100 mL via INTRAVENOUS

## 2011-01-14 DIAGNOSIS — C3490 Malignant neoplasm of unspecified part of unspecified bronchus or lung: Secondary | ICD-10-CM

## 2011-01-14 LAB — CBC
Hemoglobin: 13.1 g/dL (ref 13.0–17.0)
MCH: 27.2 pg (ref 26.0–34.0)
MCV: 85.4 fL (ref 78.0–100.0)
RBC: 4.81 MIL/uL (ref 4.22–5.81)

## 2011-01-14 LAB — DIFFERENTIAL
Lymphs Abs: 5.5 10*3/uL — ABNORMAL HIGH (ref 0.7–4.0)
Monocytes Absolute: 0.4 10*3/uL (ref 0.1–1.0)
Monocytes Relative: 4 % (ref 3–12)
Neutro Abs: 3.4 10*3/uL (ref 1.7–7.7)
Neutrophils Relative %: 34 % — ABNORMAL LOW (ref 43–77)

## 2011-01-14 LAB — COMPREHENSIVE METABOLIC PANEL
BUN: 13 mg/dL (ref 6–23)
CO2: 25 mEq/L (ref 19–32)
Chloride: 101 mEq/L (ref 96–112)
Creatinine, Ser: 0.77 mg/dL (ref 0.4–1.5)
GFR calc non Af Amer: >60 mL/min (ref >60)
Glucose, Bld: 106 mg/dL — ABNORMAL HIGH (ref 70–99)
Total Bilirubin: 0.3 mg/dL (ref 0.3–1.2)

## 2011-01-14 LAB — WOUND CULTURE: Gram Stain: NONE SEEN

## 2011-01-14 NOTE — Consult Note (Signed)
NAMEROCKFORD, LEINEN NO.:  [id number]  MEDICAL RECORD NO.:  [id number]  LOCATION:  IC07                          FACILITY:  [facility]  PHYSICIAN:  Jonelle Sidle, MD DATE OF BIRTH:  March 24, 1931  DATE OF CONSULTATION:  01/13/2011 DATE OF DISCHARGE:                                CONSULTATION   ADDENDUM:  REQUESTING SERVICE:  [facility].  PRIMARY CARE PHYSICIAN:  [name], MD.  PRIMARY CARDIOLOGIST:  Gerrit Friends. Dietrich Pates, MD, [facility]  Please refer to the full cardiology consultation dictation done by Jacolyn Reedy, PA-C.  SUMMARY:  Mr. Zappone is a 77 year-old male with a history of nonobstructive coronary artery disease documented at cardiac catheterization in 2004, including a 50% LAD stenosis that was managed medically, also previously documented normal left ventricular systolic function by echocardiography in 2008.  Additional history includes, atrial fibrillation in 2007; paroxysmal atrial flutter; gastrointestinal bleed in 2007; and small cell lung cancer, stage III, status post chemotherapy and radiation.  He was recently admitted to the hospital with intermittent dizziness, diarrhea, general malaise, and recurrent fevers and chills.  He was noted to be mildly hypotensive, working diagnosis of systemic inflammatory response syndrome, possibly with viral etiology, and partially treated urinary tract infection.  He has been monitored in the intensive care unit, and is generally improving at this point.  Workup however, has shown evidence of a newly documented cardiomyopathy, LVEF of 30-35% by echocardiogram on 6/4 associated with wall motion abnormalities, small pericardial effusion noted at that time as well.  CT scan of the chest showed no evidence of pulmonary embolus with bilateral pleural effusions, left greater than right, and a small to moderate size pericardial effusion.  Minimal airspace infiltrates were noted with scattered  atelectasis.  Telemetry has shown bursts of supraventricular tachycardia, narrow complex and regular at times, also brief bursts of nonsustained ventricular tachycardia.  These rhythms have settled down within the last 24 hours, and the patient has been tolerating Coreg and lisinopril, which has been titrated during his stay.  We have been consulted to assist with management.  ALLERGIES:  NO KNOWN DRUG ALLERGIES.  MEDICATIONS:  At the present time include carvedilol 12.5 mg p.o. b.i.d., Maxipime 1 g IV q. 12 h, Colace 100 mg p.o. b.i.d., Vibramycin 100 mg IV q. 12 h, Levaquin 750 mg IV q. 24 h, Zestril 10 mg p.o. at h.s.  PHYSICAL EXAMINATION:  VITAL SIGNS:  Recent vital signs show temperature 97.7 degrees, heart rate in the 90s, in sinus rhythm by telemetry, blood pressure 102/62, respiratory rate 22, ox saturation 96% on 2 L nasal cannula.  GENERAL:  Chronically ill-appearing male in no acute distress. HEENT:  Conjunctivae and lids are normal.  Oropharynx with moist mucosa. NECK:  Supple.  No elevated JVP or carotid bruits.  No thyromegaly. LUNGS:  Exhibit coarse breath sounds, diminished at the bases, nonlabored breathing.  CARDIAC:  Regular rate and rhythm, somewhat distant heart sounds, indistinct PMI, although no S3 gallop, no rub. ABDOMEN:  Soft, nontender.  Bowel sounds present.  EXTREMITIES:  Exhibit no significant pitting edema.  Distal pulses 1+.  SKIN:  Warm and dry. MUSCULOSKELETAL:  No kyphosis is noted.  NEUROPSYCHIATRIC:  Patient is alert and oriented x3.  Affect is appropriate.  LABORATORY DATA:  WBCs 6.3, hemoglobin 12.5, hematocrit 38.4, platelets 84, up from 61.  ESR 7.  INR 1.0, D-dimer 2.4.  Sodium 131, potassium 4.0, chloride 100, bicarb 26, glucose 103, BUN 11, creatinine 0.7, AST 44, ALT 35, albumin 2.1, magnesium 1.7.  TSH 8.2, free T4 of 0.9. Urinalysis with moderate blood, 30 protein, granular casts, and few bacteria.  [facility] spotted fever, IgM is  negative with positive IgG suggesting prior exposure, but no active infection.  Blood cultures at this point are negative.  Patient also underwent removal of Port-A-Cath that was placed years ago for chemotherapy.  This was performed yesterday by Dr. Malvin Johns, cultures pending, and at this point active infection is not suspected.  He has also had lab work sent to workup heparin-induced thrombocytopenia.  Platelet count is however improving at this time.  IMPRESSION: 1. Cardiomyopathy, LVEF 30-35%, newly diagnosed, but duration is not     clear.  This is in comparison to prior assessment in 2008.  He has     had no recent cardiac markers and electrocardiogram shows     nonspecific findings.  She had no reports of recent chest pain. 2. Previously documented history of nonobstructive coronary artery     disease, 50% left anterior descending, as outlined above.  No clear     history of myocardial infarction. 3. Paroxysmal supraventricular tachycardia with history of probable     recent intermittent atrial flutter, prior diagnosis of paroxysmal     atrial fibrillation as well.  Patient is presently in normal sinus     rhythm. 4. Brief episodes of nonsustained ventricular tachycardia,     asymptomatic. 5. History of stage III non-small cell lung cancer, status post     radiation and chemotherapy back in 2007. 6. Presentation with possible systemic inflammatory response syndrome,     perhaps viral etiology, blood cultures pending.  Hemodynamics have     improved and patient is afebrile at this time.  He is on broad-     spectrum antibiotic therapy empirically. 7. Recent removal of Port-A-Cath, at this point active infection is     not defined. 8. Thrombocytopenia, improving, HIT panel pending. 9. Small-to-moderate pericardial effusion without evidence of     tamponade.  It could be secondary to inflammation.  Pleural     effusions also noted.  RECOMMENDATIONS:  At this time, Coreg has  been recently advanced to 12.5 mg p.o. b.i.d.  Otherwise continue ACE inhibitor therapy presuming blood pressure tolerates.  Ultimately, an ischemic assessment can be considered, if the patient's condition continues to improve both objectively and clinically.  A set of cardiac markers will be sent to exclude any recent events, and a followup ECG will be obtained as well.  I would anticipate ultimately considering a followup Myoview as an initial step in his ischemic evaluation and then determine if further testing is necessary.  In the meanwhile medical therapy will be optimized.     Jonelle Sidle, MD     SGM/MEDQ  D:  01/13/2011  T:  01/13/2011  Job:  [id number]  cc:   [name], MD Fax: [phone]  Gerrit Friends. Dietrich Pates, MD, [facility] [address] [place], [place] [address]  Electronically Signed by Nona Dell MD on 01/14/2011 08:45:18 AM

## 2011-01-15 DIAGNOSIS — C3490 Malignant neoplasm of unspecified part of unspecified bronchus or lung: Secondary | ICD-10-CM

## 2011-01-15 LAB — CATH TIP CULTURE: Culture: NO GROWTH

## 2011-01-15 LAB — DIFFERENTIAL
Basophils Relative: 0 % (ref 0–1)
Eosinophils Absolute: 0.9 10*3/uL — ABNORMAL HIGH (ref 0.0–0.7)
Lymphocytes Relative: 46 % (ref 12–46)
Monocytes Absolute: 0.5 10*3/uL (ref 0.1–1.0)
Monocytes Relative: 5 % (ref 3–12)
Neutro Abs: 4.2 10*3/uL (ref 1.7–7.7)
Neutrophils Relative %: 40 % — ABNORMAL LOW (ref 43–77)

## 2011-01-15 LAB — BASIC METABOLIC PANEL
BUN: 11 mg/dL (ref 6–23)
Calcium: 9 mg/dL (ref 8.4–10.5)
GFR calc non Af Amer: >60 mL/min (ref >60)
Potassium: 3.9 mEq/L (ref 3.5–5.1)
Sodium: 135 mEq/L (ref 135–145)

## 2011-01-15 LAB — CBC
MCH: 27.9 pg (ref 26.0–34.0)
MCHC: 32.6 g/dL (ref 30.0–36.0)
MCV: 85.5 fL (ref 78.0–100.0)
Platelets: 142 10*3/uL — ABNORMAL LOW (ref 150–400)
RBC: 4.98 MIL/uL (ref 4.22–5.81)

## 2011-01-15 LAB — PROTIME-INR: Prothrombin Time: 13 seconds (ref 11.6–15.2)

## 2011-01-15 LAB — CARDIAC PANEL(CRET KIN+CKTOT+MB+TROPI)
CK, MB: 3.3 ng/mL (ref 0.3–4.0)
Total CK: 32 U/L (ref 7–232)

## 2011-01-15 LAB — CULTURE, BLOOD (ROUTINE X 2)
Culture: NO GROWTH
Culture: NO GROWTH

## 2011-01-15 LAB — GLUCOSE, CAPILLARY: Glucose-Capillary: 113 mg/dL — ABNORMAL HIGH (ref 70–99)

## 2011-01-16 LAB — CBC
MCH: 27.9 pg (ref 26.0–34.0)
MCHC: 32.5 g/dL (ref 30.0–36.0)
MCV: 85.8 fL (ref 78.0–100.0)
Platelets: 169 10*3/uL (ref 150–400)
RDW: 14.6 % (ref 11.5–15.5)
WBC: 12.9 10*3/uL — ABNORMAL HIGH (ref 4.0–10.5)

## 2011-01-16 LAB — BASIC METABOLIC PANEL
BUN: 13 mg/dL (ref 6–23)
Calcium: 9.3 mg/dL (ref 8.4–10.5)
Creatinine, Ser: 0.91 mg/dL (ref 0.4–1.5)
GFR calc non Af Amer: >60 mL/min (ref >60)
Glucose, Bld: 98 mg/dL (ref 70–99)
Sodium: 134 mEq/L — ABNORMAL LOW (ref 135–145)

## 2011-01-16 LAB — DIFFERENTIAL
Basophils Relative: 0 % (ref 0–1)
Eosinophils Absolute: 0.9 10*3/uL — ABNORMAL HIGH (ref 0.0–0.7)
Lymphs Abs: 6.1 10*3/uL — ABNORMAL HIGH (ref 0.7–4.0)
Monocytes Absolute: 1 10*3/uL (ref 0.1–1.0)
Neutrophils Relative %: 38 % — ABNORMAL LOW (ref 43–77)

## 2011-01-16 NOTE — Discharge Summary (Signed)
Stephen Martin, Stephen Martin                ACCOUNT NO.:  [id number]  MEDICAL RECORD NO.:  [id number]  LOCATION:  A311                          FACILITY:  [facility]  PHYSICIAN:  Isidor Holts, [name]  DATE OF BIRTH:  Dec 02, 1930  DATE OF ADMISSION:  01/10/2011 DATE OF DISCHARGE:  06/09/2012LH                         DISCHARGE SUMMARY-REFERRING   PRIMARY CARE PHYSICIAN:  [name], MD  PRIMARY CARDIOLOGIST:  Gerrit Friends. Dietrich Pates, MD, [facility]  DISCHARGE DIAGNOSES: 1. Febrile illness, likely secondary to tick-borne illness. 2. Tachyarrhythmia, i.e. paroxysmal atrial     fibrillation/supraventricular tachycardia/nonsustained ventricular     tachycardia. 3. Systolic dysfunction, ejection fraction 30-35%. 4. Small pericardial effusion. 5. Intracardiac apical thrombus, now on anticoagulation. 6. History of stage III lung cancer status post chemotherapy/radiation     therapy, 2007. 7. History of multiple skin squamous cell carcinoma status postresection. 8. Thrombocytopenia. 9. History of benign prostatic hypertrophy status post transurethral     resection of the prostate 5 years ago.  DISCHARGE MEDICATIONS: 1. Coreg 25 mg p.o. b.i.d. with meals. 2. Doxycycline 100 mg p.o. b.i.d. for 9 days only, to complete 14 days     of treatment. 3. Lisinopril 20 mg p.o. at bedtime. 4. Coumadin per INR currently on 7.5 mg p.o. daily. 5. Oxybutynin 5 mg p.o. daily.  PROCEDURES: 1. Chest x-ray, January 10, 2011.  This showed a low-volume chest with     bibasilar bilateral airspace disease suspicious for either     multifocal pneumonia or asymmetric/atypical pulmonary edema.  This     is superimposed on pulmonary parenchymal scarring. 2. Chest CT angiogram, January 10, 2011.  This showed a small left pleural     effusion with associated atelectasis, increasing mass effect, and     consolidation of left lower lobe probably related to increased     atelectasis from effusion.  This would be an atypical appearance   for superimposed infection/pneumonia.  There was stable pericardial     effusion, stable right middle lobe, minimal scarring.  Study was     not technically adequate for evaluation of pulmonary embolism due     to arterial phase timing.  There was no acute aortic abnormality. 3. Repeat chest CT angiogram, January 13, 2011.  This showed no evidence     of pulmonary embolism.  There were increased bilateral pleural     effusions and minimally increased pericardial effusion since     previous study.  Interval decrease in size of left lower lobe     perihilar opacity, atelectasis, and minimal air space infiltrates. 4. 2-D echocardiogram, January 11, 2011.  This showed mildly dilated left     ventricular cavity size, wall thickness with increased in pattern     of mild LVH.  Systolic function was moderately-to-severely reduced.     Estimated ejection fraction 30-35%.  Severe hypokinesis to akinesis     of multiple segments as described as moderate hypokinesis elsewhere     except for a fairly normal lateral wall.  There was a small apical thrombus associated with akinetic segment mildly calcified aortic     valve annulus which was trileaflet, calcified mitral valve annulus,     left  atrium was mildly dilated, and right atrium was mildly     dilated.  There was no atrial septal defect or patent foramen     ovale.  Minimal free-flowing pericardial effusion was identified     posterior to the heart.  Small and partially organized effusion     anterior to the right ventricle.  No evidence of tamponade.  CONSULTATIONS: 1. Barbaraann Barthel, MD, surgeon. 2. Jonelle Sidle, MD, cardiologist. 3. [name]. [name], MD, hematologist/oncologist.  ADMISSION HISTORY:  As in H and P notes of January 10, 2011, dictated by Dr. [name].  However, in brief, this is a 77 year old male, with known history of stage III lung cancer, 2007, status post chemotherapy/radiation therapy, BPH status post TURP  approximately 5 years ago, history of invasive squamous cell carcinoma of the skin requiring excision of parotid gland and extensive lymph node dissection of head and neck at [facility], multiple squamous cell carcinomas of head and neck, currently under dermatologist's treatment; history of peptic ulcer disease status post remote GI bleed, remote history of postoperative atrial fibrillation, presenting with recurrent fever, chills, and dizziness.  The patient had presented to the emergency department 4 days prior, with nausea, vomiting, diarrhea, was evaluated, felt to be having a urinary tract infection, given a dose of Rocephin and discharged home with a prescription for ciprofloxacin.  Before the patient could fill his prescription however, he returned to the emergency department with fever and chills, looked quite ill, received intravenous fluids, and was admitted by the [facility] with continued intravenous fluids and antibiotic therapy.  On January 09, 2011, the patient felt considerably better.  He was therefore discharged to home.  However, since arrival at home, he continued having fevers, chills, near-syncopal episodes, one episode of vomiting and therefore returned to the emergency department, where he was found to have a tachycardia ranging between the 120s-180s and was therefore admitted for further evaluation, investigation, and management.  CLINICAL COURSE: 1. Febrile illness.  This was deemed likely secondary to a tick-borne     illness.  The patient presented as described above.  However, on     detailed questioning, it turns out that he had sustained a number     of tick bites a few days prior to presentation.  Chest x-ray on the      other hand, showed findings consistent with possible multifocal     pneumonia, although these were not substantiated on subsequent     chest CT angiogram.  He was empirically commenced on a  combination     of Levaquin, doxycycline, and Maxipime. Septic workup was carried     out, including urinalysis and culture, blood cultures.  Urinalysis     showed wbc 7-10, rbc 11-20, bacteria few.  There was no growth on     urine culture, neither was there on blood cultures.  D-dimer was     elevated at 2.43, however, subsequent chest CT angiogram showed no     evidence of pulmonary embolism.  [facility] spotted fever IgM     was normal at 0.19, [facility] spotted fever IgG was elevated     at 0.81, but clinical significance of this is uncertain.  However, it     did not appear to be consistent with an acute infection.  Also     given the geographic locality and the absence of any other     attributable course of the patient's pyrexia,  it was concluded that     he most likely had a tick-borne illness, possibly even     ehrlichiosis.  Antibiotic was therefore rationalized to monotherapy     with doxycycline and throughout the rest of the course of his     hospitalization the patient had no further recurrences of pyrexia,     and felt considerably better.  This diagnosis further was butressed by     the fact that the patient did have a significant thrombocytopenia     in the absence of positive screen for DIC or HIT.  Over the course     of his hospitalization, the patient's platelet count has showed     steady improvement and as a matter of fact was 142 as of January 15, 2011 against a preadmission platelet count of 69.  The patient is     anticipated to complete a 14-day course of doxycycline.  Of note,     the patient's right subclavian Port-A-Cath was removed by Dr.     Malvin Johns, general surgeon, on January 12, 2011 upon request, to eliminate      a posible nidus of infection, although clinically the     Port-A-Cath did ot appear infected.  2. Tachyarrhythmia.  As described above, the patient on presentation     to the emergency department, had a significant tachycardia with      heart rate ranging between 120-180.  As a matter of fact, he     required an intravenous dose of metoprolol to control heart rate.     Although this was previously described as a sinus tachycardia, the     patient persistently was tachyarrhythmic during the initial part of     his hospitalization and subsequent 12-lead EKG demonstrated     paroxysmal atrial fibrillation, occasionally SVT, and also     nonsustained ventricular tachycardia.  He responded to scheduled     beta-blocker treatment.  Cardiology consultation was kindly     provided by Dr. Nona Dell, who concurred with beta-blocker     use.  3. Cardiomyopathy.  The patient's chest CT angiogram showed a     pericardial effusion.  To evaluate this further, a 2-D     echocardiogram was done on January 11, 2011.  For details, refer to     procedure list above.  This demonstrated a pericardial effusion as     well as a diminished ejection fraction of 30-35% and a small apical     intracardiac thrombosis as well as areas of akinesis/hypokinesis     suggestive of possible ischemic cardiomyopathy. As described above.     Cardiology consultation was provided by Dr. Nona Dell.  The     patient is currently on a regimen of beta blockade as well as ACE     inhibitor therapy and clinically, has shown no overt evidence of     congestive heart failure.  It appears that cardiac workup is indicated     and the cardiologists have opined that a stress Myoview and     possibly cardiac catheterization will be carried out.  However,     this is likely to be done on an outpatient basis.  4. Intracardiac thrombus.  As described above, the patient was found     on echocardiogram to have a small intracardiac thrombus.  He has     been anticoagulated accordingly.  5. History of stage III lung cancer.  The  patient is stable from this     viewpoint.  He was seen during this hospitalization by Dr.     Tye Maryland al, and no specific  recommendations were made with     regards to the patient's neoplastic disease.  6. History of multiple skin cancers.  The patient is under treatment     by dermatologist in [place] for this.  He will follow up with     this dermatologist, following discharge.  7. History of BPH.  The patient is status post TURP about 5 years ago.     He continues on Oxybutynin and had no problems referable to     prostatism.  DISPOSITION:  The patient as of January 15, 2011 had attained clinical stability.  There were no new issues.  He was very keen to be discharged.  He was subsequently seen by Dr. Dietrich Pates who cleared the patient for discharge from cardiology viewpoint.  Provided no acute problems arise in the interim, the patient will be discharged on January 16, 2011.  ACTIVITY:  As tolerated, recommended to increase activity slowly.  DIET:  Heart-healthy.  FOLLOWUP INSTRUCTIONS:  The patient will follow up routinely with his primary MD, Dr. Lilyan Punt, but certainly within 2 weeks of discharge. In addition, he will follow up with Dr. Carson Bing, cardiologist and the date to be determined.  Dr. Marvel Plan office will arrange an appointment and contact the patient.  Furthermore, the patient will followup at the [facility] in the a.m. of January 18, 2011 for PT/INR check and modification of Coumadin dosage, if indicated.  SPECIAL INSTRUCTIONS:  Home Health Care has been arranged.     Isidor Holts, [name]     CO/MEDQ  D:  01/15/2011  T:  01/15/2011  Job:  [id number]  cc:   [name], MD Fax: [phone]  Gerrit Friends. Dietrich Pates, MD, [facility] [address] [place], [place] [address]  Electronically Signed by Isidor Holts [name] on 01/16/2011 06:43:36 PM

## 2011-01-17 LAB — ANAEROBIC CULTURE: Gram Stain: NONE SEEN

## 2011-01-18 ENCOUNTER — Ambulatory Visit (INDEPENDENT_AMBULATORY_CARE_PROVIDER_SITE_OTHER): Payer: Medicare Other | Admitting: *Deleted

## 2011-01-18 DIAGNOSIS — Z7901 Long term (current) use of anticoagulants: Secondary | ICD-10-CM

## 2011-01-18 DIAGNOSIS — I8290 Acute embolism and thrombosis of unspecified vein: Secondary | ICD-10-CM

## 2011-01-18 DIAGNOSIS — I749 Embolism and thrombosis of unspecified artery: Secondary | ICD-10-CM

## 2011-01-18 DIAGNOSIS — I10 Essential (primary) hypertension: Secondary | ICD-10-CM

## 2011-01-18 DIAGNOSIS — I4891 Unspecified atrial fibrillation: Secondary | ICD-10-CM | POA: Insufficient documentation

## 2011-01-18 MED ORDER — WARFARIN SODIUM 5 MG PO TABS: 5.0000 mg | ORAL_TABLET | ORAL | Status: DC

## 2011-01-20 NOTE — Consult Note (Signed)
  NAME:  Stephen Martin, Stephen Martin                ACCOUNT NO.:  [id number]  MEDICAL RECORD NO.:  [id number]  LOCATION:  IC07                          FACILITY:  [facility]  PHYSICIAN:  Barbaraann Barthel, [name] DATE OF BIRTH:  22-Nov-1930  DATE OF CONSULTATION:  01/11/2011 DATE OF DISCHARGE:                                CONSULTATION   NOTE:  Surgery was asked to see this 77 year old white male who is known to me in the past.  In essence, he was admitted to the intensive care unit on the Hospitalist Service for recurrent fevers of unknown origin. He has been worked up for this and his blood cultures at present have been negative.  He urine cultures have been negative.  He does have a Port-A-Cath that I placed in 2005, for chemotherapy for the treatment of his small cell carcinoma of the lung.  In consultations with the oncologists, they have asked this to be removed as they no longer needed it and it is one less source for the possibility of an infection and they have asked me to remove this.  Clinically, it does not look infected but since it is no longer needed and under these circumstances, it certainly makes good sense to remove this.  I have examined the patient, reviewed the chart, and plan to do this in the morning.  I have discussed this with the family and we will hold his anticoagulation medications and plan on doing this tomorrow as soon as possible.     Barbaraann Barthel, [name]     WB/MEDQ  D:  01/11/2011  T:  01/12/2011  Job:  [id number]  cc:   [facility]  [name], MD Fax: [phone]  Gerrit Friends. Dietrich Pates, MD, [facility] [address] [place], [place] [address]  Electronically Signed by Barbaraann Barthel [name] on 01/20/2011 01:53:42 PM

## 2011-01-20 NOTE — Op Note (Signed)
  NAMEAKSHATH, MCCAREY NO.:  [id number]  MEDICAL RECORD NO.:  [id number]  LOCATION:  IC07                          FACILITY:  [facility]  PHYSICIAN:  Barbaraann Barthel, [name] DATE OF BIRTH:  1930-08-28  DATE OF PROCEDURE:  01/12/2011 DATE OF DISCHARGE:                              OPERATIVE REPORT   SURGEON:  Barbaraann Barthel, MD  PREOPERATIVE DIAGNOSIS:  Fever of known etiology status post placement of right subclavian Port-A-Cath in 2005 for treatment of small cell carcinoma of the lung.  POSTOPERATIVE DIAGNOSIS:  Fever of known etiology status post placement of right subclavian Port-A-Cath in 2005 for treatment of small cell carcinoma of the lung.  NOTE:  This 77 year old white male was admitted to the Hospitalist Service with fever of unknown origin.  Oncology was consulted regarding the catheter.  Clinically, there was no sign of any infection around the catheter, however, since the catheter was no longer needed, it made sense to remove it, leaving one less nidus for infection existent.  Surgery was consulted and we made plans to remove this with appropriate cultures on the following day.  TECHNIQUE:  The patient was placed in the supine position.  The right hemithorax was prepped with Betadine solution and draped in usual manner.  I used 9 mL of 1% Xylocaine without epinephrine to anesthetize the area around the Port-A-Cath infusion device.  This was then incised and the Port-A-Cath was removed around the pseudocapsule of the catheter site.  This was ligated with 3-0 Polysorb and then we irrigated with normal saline solution.  We cultured the pocket and then placed the tip in a culture medium as well.  We then irrigated and closed the skin with a 4-0 Polysorb in the subcutaneous layer and 5-0 nylon for the skin. Prior to closure, all sponge, needle, and instrument counts were found to be correct.  Estimated blood loss was minimal.  The patient  tolerated the procedure well.  Sterile dressing with bacitracin and 2x2, and an OpSite was placed.  This patient will be returned to the Hospitalist Service with no change of service and to continue his activity, diet, and medications as per the Hospitalist Service.  They may change his dressing site and cleaned with alcohol daily and I will remove the stitches in approximately a week to 10 days.     Barbaraann Barthel, [name]     WB/MEDQ  D:  01/12/2011  T:  01/13/2011  Job:  [id number]  cc:   [facility]  [name]. [name], MD Fax: [phone]  Electronically Signed by Barbaraann Barthel [name] on 01/20/2011 01:55:27 PM

## 2011-01-21 ENCOUNTER — Encounter (HOSPITAL_COMMUNITY): Payer: Self-pay | Admitting: Radiology

## 2011-01-21 ENCOUNTER — Emergency Department (HOSPITAL_COMMUNITY): Payer: Medicare Other

## 2011-01-21 ENCOUNTER — Inpatient Hospital Stay (HOSPITAL_COMMUNITY)
Admission: EM | Admit: 2011-01-21 | Discharge: 2011-01-23 | DRG: 556 | Disposition: A | Discharge status: Home Health | Payer: Medicare Other | Attending: Internal Medicine | Admitting: Internal Medicine

## 2011-01-21 ENCOUNTER — Encounter (HOSPITAL_COMMUNITY): Payer: Medicare Other

## 2011-01-21 ENCOUNTER — Ambulatory Visit: Payer: Self-pay | Admitting: *Deleted

## 2011-01-21 DIAGNOSIS — Z7901 Long term (current) use of anticoagulants: Secondary | ICD-10-CM

## 2011-01-21 DIAGNOSIS — I8290 Acute embolism and thrombosis of unspecified vein: Secondary | ICD-10-CM

## 2011-01-21 DIAGNOSIS — T45515A Adverse effect of anticoagulants, initial encounter: Secondary | ICD-10-CM | POA: Diagnosis present

## 2011-01-21 DIAGNOSIS — Z85118 Personal history of other malignant neoplasm of bronchus and lung: Secondary | ICD-10-CM

## 2011-01-21 DIAGNOSIS — I5189 Other ill-defined heart diseases: Secondary | ICD-10-CM | POA: Diagnosis present

## 2011-01-21 DIAGNOSIS — M7981 Nontraumatic hematoma of soft tissue: Principal | ICD-10-CM | POA: Diagnosis present

## 2011-01-21 DIAGNOSIS — I4891 Unspecified atrial fibrillation: Secondary | ICD-10-CM

## 2011-01-21 DIAGNOSIS — I10 Essential (primary) hypertension: Secondary | ICD-10-CM | POA: Diagnosis present

## 2011-01-21 HISTORY — DX: Essential (primary) hypertension: I10

## 2011-01-21 LAB — CBC
HCT: 38.7 % — ABNORMAL LOW (ref 39.0–52.0)
Hemoglobin: 12.4 g/dL — ABNORMAL LOW (ref 13.0–17.0)
MCH: 27.9 pg (ref 26.0–34.0)
MCHC: 32 g/dL (ref 30.0–36.0)
MCV: 87 fL (ref 78.0–100.0)
RDW: 14.5 % (ref 11.5–15.5)

## 2011-01-21 LAB — BASIC METABOLIC PANEL
BUN: 24 mg/dL — ABNORMAL HIGH (ref 6–23)
CO2: 27 mEq/L (ref 19–32)
Chloride: 102 mEq/L (ref 96–112)
Creatinine, Ser: 0.99 mg/dL (ref 0.4–1.5)
Potassium: 4 mEq/L (ref 3.5–5.1)

## 2011-01-21 MED ORDER — IOHEXOL 300 MG/ML  SOLN
100.0000 mL | Freq: Once | INTRAMUSCULAR | Status: AC | PRN
  Administered 2011-01-21: 100 mL via INTRAVENOUS

## 2011-01-22 DIAGNOSIS — R000 Tachycardia, unspecified: Secondary | ICD-10-CM

## 2011-01-22 LAB — PROTIME-INR
INR: 1.36 (ref 0.00–1.49)
Prothrombin Time: 17 seconds — ABNORMAL HIGH (ref 11.6–15.2)

## 2011-01-22 LAB — BASIC METABOLIC PANEL
Calcium: 9.3 mg/dL (ref 8.4–10.5)
Creatinine, Ser: 0.86 mg/dL (ref 0.50–1.35)
GFR calc Af Amer: >60 mL/min (ref >60)
GFR calc non Af Amer: >60 mL/min (ref >60)
Sodium: 136 mEq/L (ref 135–145)

## 2011-01-22 LAB — CBC
HCT: 35.9 % — ABNORMAL LOW (ref 39.0–52.0)
MCHC: 31.8 g/dL (ref 30.0–36.0)
Platelets: 195 10*3/uL (ref 150–400)
RDW: 14.3 % (ref 11.5–15.5)
WBC: 9.3 10*3/uL (ref 4.0–10.5)

## 2011-01-22 LAB — ABO/RH: ABO/RH(D): A POS

## 2011-01-22 LAB — URINALYSIS, ROUTINE W REFLEX MICROSCOPIC
Bilirubin Urine: NEGATIVE
Glucose, UA: NEGATIVE mg/dL
Ketones, ur: NEGATIVE mg/dL
Leukocytes, UA: NEGATIVE
Nitrite: NEGATIVE
Specific Gravity, Urine: 1.025 (ref 1.005–1.030)
pH: 6 (ref 5.0–8.0)

## 2011-01-22 LAB — URINE MICROSCOPIC-ADD ON

## 2011-01-22 NOTE — Consult Note (Signed)
Stephen Martin, Stephen Martin NO.:  [id number]  MEDICAL RECORD NO.:  [id number]  LOCATION:                                 FACILITY:  PHYSICIAN:  Jonelle Sidle, MD DATE OF BIRTH:  12/24/1930  DATE OF CONSULTATION: DATE OF DISCHARGE:                                CONSULTATION   ADDENDUM  REQUESTING SERVICE:  [facility].  PRIMARY CARDIOLOGIST:  Gerrit Friends. Dietrich Pates, MD, [facility]  Please see the full dictated cardiology consultation by Ms. Stephen Martin, nurse practitioner.  SUMMARY:  Stephen Martin is a 77 year old male with a history of recent sepsis-like syndrome, recurrent tachyarrhythmias, also recent diagnosis of cardiomyopathy by echocardiogram, left ventricular ejection fraction of 30-35% with wall motion abnormalities and a small apical thrombus.  He was felt to have a possible tick-borne illness based on his most recent hospitalization, and was treated medically for his cardiomyopathy, ultimately placed on Coumadin with documented paroxysmal atrial fibrillation as well as his small apical thrombus.  Complicating medical illness includes a history of stage III lung cancer status post chemotherapy and radiation, with recent removal of a previously used Port-A- Cath.  It was anticipated that he would follow up with Dr. Dietrich Pates for clinical assessment, and likely a Myoview to evaluate for ischemia.  He was also to be enrolled in the [facility].  He is now readmitted to the hospital complaining of left lower abdominal soreness ultimately with development of ecchymosis in the region of the left groin and left lower quadrant, subsequently noted to have evidence of a rectus sheath hematoma.  His INR at presentation was 3.4, and at this point he has been taken off of Coumadin and given vitamin K and FFP.  INR is down to 1.3.  His hemoglobin has trended from 12.4 to 11.4.   We have been consulted to assist with his management. He does not  endorse any increased dyspnea or chest pain.  ALLERGIES:  No known drug allergies.  MEDICATIONS AT THE PRESENT TIME: 1. Coreg 12.5 mg p.o. b.i.d. 2. Vibramycin 100 mg p.o. b.i.d. 3. Zestril 20 mg p.o. nightly. 4. Ditropan 5 mg p.o. daily.  PHYSICAL EXAMINATION:  MOST RECENT VITAL SIGNS:  Temperature 98.10 degrees, heart rate 86, respirations 20, blood pressure 139/81. GENERAL:  This is a chronically ill appearing elderly male, in no acute distress. HEENT:  Conjunctivae and lids are normal.  Oropharynx clear with moist mucosa. NECK:  Supple.  No elevated JVP or carotid bruits.  No thyromegaly. LUNGS:  Exhibit coarse breath sounds diminished but overall nonlabored. CARDIAC:  Regular rate and rhythm with distant heart sounds, soft systolic murmur, indistinct PMI but no S3 gallop. ABDOMEN:  Soft, mildly tender in the lower quadrant, principally on the left. GENITOURINARY:  There is ecchymosis in the region of the scrotum and left side of the groin. EXTREMITIES:  Exhibit no significant pitting edema.  Distal pulses are 1- 2 plus. SKIN:  Otherwise, warm and dry. MUSCULOSKELETAL:  No kyphosis. NEUROPSYCHIATRIC:  The patient is alert and oriented x3.  Affect is appropriate.  LABORATORY DATA:  WBCs 9.3, hemoglobin 11.4, hematocrit 35.9, platelets 195.  INR 1.3, down from 3.4.  Sodium 136, potassium 4.2, BUN 18, creatinine 0.8.  IMPRESSION: 1. Spontaneous rectus sheath hematoma with associated anemia, on     Coumadin for anticoagulation in the setting of paroxysmal atrial     fibrillation and a small left ventricular mural thrombus.  INR was     mildly supratherapeutic at presentation, 3.4 level, now down to 1.3     following vitamin K and fresh frozen plasma. 2. Cardiomyopathy, left ventricular ejection fraction 30-35% based on     recent echocardiogram on January 11, 2011.  Wall motion abnormalities     suggest probable ischemic etiology, however, not as yet confirmed. 3. Small  apical mural thrombus associated with akinetic myocardial     segment. 4. History of stage III lung cancer status post chemotherapy and     radiation back in 2007. 5. History of supraventricular tachycardia and nonsustained     ventricular tachycardia. 6. Recent febrile illness with sepsis-like syndrome, possibly     following tick bite, refer to recent discharge summary from January 15, 2011.  RECOMMENDATIONS:  Would agree with discontinuing Coumadin at this point.  We will ensure that office followup is arranged with Dr. Dietrich Pates, as the patient will need continued medical therapy for his cardiomyopathy, and most likely followup ischemic evaluation, potentially via Myoview as an initial step.     Jonelle Sidle, MD     SGM/MEDQ  D:  01/22/2011  T:  01/22/2011  Job:  [id number]  cc:   Gerrit Friends. Dietrich Pates, MD, [facility] [address] [place], [place] [address]  [facility]  Electronically Signed by Nona Dell MD on 01/22/2011 01:24:59 PM

## 2011-01-23 LAB — PREPARE FRESH FROZEN PLASMA: Unit division: 0

## 2011-01-24 NOTE — Group Therapy Note (Signed)
  NAME:  Stephen Martin, Stephen Martin                ACCOUNT NO.:  [id number]  MEDICAL RECORD NO.:  [id number]  LOCATION:  A327                          FACILITY:  [facility]  PHYSICIAN:  Wilson Singer, M.D.DATE OF BIRTH:  10-Sep-1930  DATE OF PROCEDURE:  01/22/2011 DATE OF DISCHARGE:                                PROGRESS NOTE   This man was admitted yesterday with discoloration in the groin and was found to have some active bleeding in the rectus sheath.  He had recently been put on Coumadin for a small apical thrombus and an ejection fraction of 35%.  When he came to the hospital, his INR was 3.44, just above the therapeutic range.  This has been reversed using vitamin K and fresh frozen plasma.  PHYSICAL EXAMINATION:  GENERAL:  He feels well, otherwise.  He looks well. VITAL SIGNS:  Temperature 98, blood pressure 139/81, pulse 86. HEART:  Heart sounds are present and normal. LUNGS:  Lung fields are clear and there is clearly bruising around the lower abdomen and groin area today.  INVESTIGATIONS:  Sodium 136, potassium 4.2, bicarbonate 28, BUN 18, creatinine 0.86, INR 1.36.  Hemoglobin 11.4, white blood cell count 9.3, platelets 195.  IMPRESSION: 1. Rectus sheath bleeding in a hypercoagulated state. 2. History of left ventricular apical thrombus in setting of low     ejection fraction. 3. Hypertension.  PLAN: 1. Monitor serial hemoglobin. 2. Cardiology input to see further recommendations for the apical     thrombus.  Clearly, he would not be glad to continue with Coumadin     at this time and may be this can be revisited.     Wilson Singer, [name]     NCG/MEDQ  D:  01/22/2011  T:  01/22/2011  Job:  [id number]  Electronically Signed by Lilly Cove [name] on 01/24/2011 07:13:18 AM

## 2011-01-24 NOTE — Discharge Summary (Signed)
  Stephen Martin, Stephen Martin                ACCOUNT NO.:  [id number]  MEDICAL RECORD NO.:  [id number]  LOCATION:  A327                          FACILITY:  [facility]  PHYSICIAN:  Wilson Singer, M.D.DATE OF BIRTH:  1931-04-04  DATE OF ADMISSION:  01/22/2011 DATE OF DISCHARGE:  06/16/2012LH                              DISCHARGE SUMMARY   FINAL DISCHARGE DIAGNOSES: 1. Abdominal wall muscle bleeding in a hypercoagulable state, reversed     with vitamin K and fresh frozen plasma. 2. Small left ventricular apical thrombus in the setting of low     ejection fraction. 3. Hypertension.  CONDITION ON DISCHARGE:  Stable.  MEDICATIONS ON DISCHARGE:  The patient will continue all home medications except Coumadin which he will need to discontinue.  MEDICATIONS: 1. Carvedilol 25 mg b.i.d. 2. Doxycycline 100 mg b.i.d. 3. Lisinopril 20 mg nightly. 4. Oxybutynin 5 mg daily.  HISTORY:  This very pleasant 77 year old man was admitted with discoloration of the groin area.  Please see initial history and physical examination done by Dr. [name].  HOSPITAL PROGRESS:  The patient was monitored.  His INR on admission was 3.44.  He was given vitamin K and fresh frozen plasma.  He has had no further bleeding clinically.  His hemoglobin yesterday was 11.4.  On physical examination today, temperature 97.8, blood pressure 138/80, pulse 82, and saturation 94%.  He looks systemically well.  He is hemodynamically stable.  He does not look pale.  There is bruising in the lower abdomen going into the scrotum.  However, there is no tenderness whatsoever and there is no indication clinically of new hematoma formation.  DISPOSITION:  The patient is stable to be discharged home and I have told him that should the bruising get much worse or he gets pain in the lower abdomen or scrotum, he must come back to the hospital.  Otherwise, he needs to follow up with his primary care physician in the next couple of  days to make sure that there is no clinical evidence of further bleeding.  He will follow up with cardiologist in the next couple of weeks to discuss the possibility of using aspirin perhaps for this small apical thrombus.     Wilson Singer, [name]     NCG/MEDQ  D:  01/23/2011  T:  01/23/2011  Job:  [id number]  cc:   [name], MD Fax: [phone]  Gerrit Friends. Dietrich Pates, MD, [facility] [address] [place], [place] [address]  Electronically Signed by Lilly Cove [name] on 01/24/2011 07:13:12 AM

## 2011-01-25 NOTE — H&P (Signed)
Stephen Martin, Stephen Martin                ACCOUNT NO.:  [id number]  MEDICAL RECORD NO.:  [id number]  LOCATION:                                 FACILITY:  PHYSICIAN:  [name], [name] DATE OF BIRTH:  10-15-1930  DATE OF ADMISSION:  01/22/2011 DATE OF DISCHARGE:  LH                             HISTORY & PHYSICAL   PRIMARY CARE PHYSICIAN:  [name], MD  CARDIOLOGIST:  Gerrit Friends. Dietrich Pates, MD, [facility]  DICTATING PHYSICIAN:  [name], MD  CHIEF COMPLAINT:  Discoloration in the groin area since yesterday.  HISTORY OF PRESENT ILLNESS:  This is a 77 year old Caucasian gentleman who was admitted several times in recent weeks for sepsis like syndrome and during evaluation for an episode of tachydysrhythmia while in the hospital, the patient was found to have severe left ventricular dysfunction, ejection fraction 35% with a small apical thrombus.  The patient was started on anticoagulation and discharged home on June 8.The patient has been in fair stable condition until yesterday when he started noticing discoloration around his penis and his left groin and worsening pain in the left lower quadrant.  Eventually the patient came to the emergency room.  He has had a CT scan done which shows hematoma and suggestion of ongoing bleed in the left rectus sheath.  Hospitalist Service was called to assist with management.  The patient denies any headache.  He denies any nosebleeds.  He denies any falls or injury to his abdomen.  He denies any black or bloody stool.  PAST MEDICAL HISTORY: 1. Recent febrile illness likely secondary to tick bite. 2. Tachyarrhythmia including paroxysmal atrial fibrillation,     supraventricular tachycardia, and nonsustained ventricular     tachycardia. 3. Systolic dysfunction, ejection fraction of 30-35%. 4. Intracardiac apical thrombus started on anticoagulation. 5. History of stage III lung cancer status post chemotherapy and     radiation in  2007. 6. History of multiple squamous cell carcinomas. 7. Chronic thrombocytopenia. 8. Benign prostatic hypertrophy status post TURP 5 years ago.  MEDICATIONS: 1. Coreg 25 mg twice daily. 2. Doxycycline 100 mg twice daily scheduled to be completed on June     17. 3. Lisinopril 20 mg at bedtime. 4. Coumadin per Cardiology. 5. Oxybutynin 5 mg daily.  ALLERGIES:  No known drug allergies.  SOCIAL HISTORY:  Denies tobacco, alcohol, illicit drug use.  Lives with his wife who is present at this admission.  FAMILY HISTORY:  Reviewed and unchanged from previous admissions.  REVIEW OF SYSTEMS:  Other than noted above unremarkable.  PHYSICAL EXAMINATION:  GENERAL:  Pleasant elderly Caucasian gentleman reclining in the stretcher. VITAL SIGNS:  Temperature is 98, pulse 95, respiration 18, blood pressure 146/81.  He is saturating 96% on room air. HEENT:  His pupils are round and equal.  Mucous membranes pink. Anicteric.  Not dehydrated. NECK:  No cervical lymphadenopathy.  No thyromegaly or carotid bruit. CHEST:  Clear to auscultation bilaterally. CARDIOVASCULAR SYSTEM:  Regular rhythm.  No murmur. ABDOMEN:  Soft.  He is tender in the left lower quadrant.  His perineum, he has ecchymosis extending from the base of his penis down to the prepuce and also involving the  scrotum and left side of the groin and no inguinal lymphadenopathy. EXTREMITIES:  Without edema, has 2+ dorsalis pedis pulses bilaterally. CENTRAL NERVOUS SYSTEM:  Cranial nerves II-XII grossly intact.  He has no focal lateralizing signs.  LABORATORY DATA:  His white count is elevated at 12.2, hemoglobin 12.4 down from 13.9 a week ago, his platelet count is 226,000.  His INR is slightly supratherapeutic at 3.44.  His sodium is 134, potassium 4.0, chloride 102, CO2 27, glucose 93, BUN 24, creatinine 0.99, calcium 8.9. Urinalysis shows protein 38, otherwise unremarkable.  It is heme- negative.  Urine microscopy shows 3-6 white  cells, otherwise unremarkable.  CT scan of the abdomen and pelvis with contrast shows an expanded rectus sheath, associated stranding and __________ intravenous contrast consistent with active hemorrhage, a trace pericardial effusion, status post cholecystectomy, diverticulosis without evidence of acute diverticulitis.  ASSESSMENT: 1. Acute and progressive abdominal wall bleed secondary to     coagulopathy. 2. Coumadin coagulopathy. 3. History of left apical thrombus in the left ventricle. 4. Hypertension. 5. Systolic dysfunction as noted above.  PLAN: 1. We will bring this gentleman for management of his coagulopathy     since, if the bleeding continues, it could be serious and     difficult to control .  We will elect at this time to partially     reverse his anticoagulation and monitor his H&H.  We will give FFP     and a moderate dose of vitamin K.  I have discussed the risks     involved     with the patient including risk of stroke from the apical thrombus. 2. We will consult his cardiologist in the morning for assistance with     management. 3. Other plans as per orders.     [name], [name]  LC/MEDQ  D:  01/22/2011  T:  01/22/2011  Job:  [id number]  cc:   [name], MD Fax: [phone]  Gerrit Friends. Dietrich Pates, MD, [facility] [address] [place], [place] [address]  [name]. [name], MD Fax: [phone]  Electronically Signed by [name] [name] on 01/25/2011 02:51:28 AM

## 2011-01-27 ENCOUNTER — Encounter (HOSPITAL_COMMUNITY): Payer: Medicare Other

## 2011-02-04 ENCOUNTER — Ambulatory Visit (INDEPENDENT_AMBULATORY_CARE_PROVIDER_SITE_OTHER): Payer: Medicare Other | Admitting: Adult Health

## 2011-02-04 ENCOUNTER — Encounter: Payer: Self-pay | Admitting: Adult Health

## 2011-02-04 ENCOUNTER — Other Ambulatory Visit: Payer: Self-pay | Admitting: *Deleted

## 2011-02-04 VITALS — BP 104/67 | HR 89 | Ht 72.0 in | Wt 194.0 lb

## 2011-02-04 DIAGNOSIS — I10 Essential (primary) hypertension: Secondary | ICD-10-CM

## 2011-02-04 DIAGNOSIS — I4891 Unspecified atrial fibrillation: Secondary | ICD-10-CM

## 2011-02-04 DIAGNOSIS — I236 Thrombosis of atrium, auricular appendage, and ventricle as current complications following acute myocardial infarction: Secondary | ICD-10-CM

## 2011-02-04 DIAGNOSIS — I2510 Atherosclerotic heart disease of native coronary artery without angina pectoris: Secondary | ICD-10-CM

## 2011-02-04 DIAGNOSIS — I238 Other current complications following acute myocardial infarction: Secondary | ICD-10-CM

## 2011-02-04 MED ORDER — LISINOPRIL 20 MG PO TABS: 20.0000 mg | ORAL_TABLET | Freq: Every day | ORAL | Status: DC

## 2011-02-04 MED ORDER — CARVEDILOL 25 MG PO TABS: 25.0000 mg | ORAL_TABLET | Freq: Two times a day (BID) | ORAL | Status: DC

## 2011-02-04 MED ORDER — OXYBUTYNIN CHLORIDE 5 MG PO TABS: 5.0000 mg | ORAL_TABLET | Freq: Every day | ORAL | Status: DC

## 2011-02-04 NOTE — Assessment & Plan Note (Signed)
Low normal at this time. Will continue to monitor this and make adjustments in medications if necessary.
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
## 2011-02-04 NOTE — Assessment & Plan Note (Signed)
Plan stress myoview at Dr.Rothbarts discretion on next visit.

## 2011-02-04 NOTE — Patient Instructions (Signed)
Your physician recommends that you schedule a follow-up appointment in: 3 months.  

## 2011-02-04 NOTE — Assessment & Plan Note (Signed)
Heart rate remains in NSR per EKG.  He is doing well after discontinuation of coumadin.  I will continue current medications without changes.  He will follow-up with Dr. Dietrich Pates in 3 months.  May consider stress test at that time, once he has fully recovered most of his strength. As he is asymptomatic now will not proceed at this time.

## 2011-02-09 ENCOUNTER — Emergency Department (HOSPITAL_COMMUNITY)
Admission: EM | Admit: 2011-02-09 | Discharge: 2011-02-09 | Disposition: A | Discharge status: Home / Self Care | Payer: Medicare Other | Attending: Emergency Medicine | Admitting: Emergency Medicine

## 2011-02-09 DIAGNOSIS — Z79899 Other long term (current) drug therapy: Secondary | ICD-10-CM | POA: Insufficient documentation

## 2011-02-09 DIAGNOSIS — I10 Essential (primary) hypertension: Secondary | ICD-10-CM | POA: Insufficient documentation

## 2011-02-09 DIAGNOSIS — L989 Disorder of the skin and subcutaneous tissue, unspecified: Secondary | ICD-10-CM | POA: Insufficient documentation

## 2011-02-09 NOTE — Consult Note (Signed)
NAMEKANAI, Stephen Martin                ACCOUNT NO.:  [id number]  MEDICAL RECORD NO.:  [id number]  LOCATION:  A327                          FACILITY:  [facility]  PHYSICIAN:  Jonelle Sidle, MD DATE OF BIRTH:  [birth year]  DATE OF CONSULTATION:  01/22/2011 DATE OF DISCHARGE:                                CONSULTATION   PRIMARY CARDIOLOGIST:  Gerrit Friends. Dietrich Pates, MD, [facility]  PRIMARY CARE PHYSICIAN:  [name], MD  REQUESTING PHYSICIAN:  [facility].  REASON FOR CONSULTATION:  Coumadin recommendations in the setting of a rectal sheath bleed.  HISTORY OF PRESENT ILLNESS:  This is a 77 year old Caucasian male well- known to [place] from recent admission 9 days ago, where he was admitted with sepsis, tachy arrhythmias with a history of systolic dysfunction with an EF of 30-35%.  He was also noted to have an apical thrombus on echo at that time.  He was placed on Coumadin on discharge.  The patient had complaints of pain and swelling in her left groin and lower quadrant and left testicle area.  CT scan of the pelvis showed hematoma with ongoing bleed in the left rectus sheath.  On arrival, the patient's PT was 35.2 with an INR of 30.4.  The patient was given FFP and vitamin K and admitted for further evaluation.  We are asked for recommendations from Cardiology standpoint.  Currently, the patient is without main complaint.  He does have occasional discomfort in the left groin area, although this is not severe.  REVIEW OF SYSTEMS:  Positive for left groin pain and left testicular pain and mild lower left quadrant pain.  All other systems were reviewed and are found to be negative unless listed above.  CODE STATUS:  Full.  PAST MEDICAL HISTORY: 1. Paroxysmal atrial flutter. 2. Thrombocytopenia. 3. Cardiac catheterization secondary to abnormal stress test revealing     mild coronary artery disease at 50% LAD in 2004. 4. Left apical thrombus found on echocardiogram  June 2012. 5. Hypertension. 6. History of stage III lung cancer with radiation and chemo.  He also had a history of GI bleed.  PAST SURGICAL HISTORY:  TURP.  Most recent echocardiogram dated January 11, 2011, revealing an EF of 30-35% with severe hypokinesis to akinesis with multiple segments with moderate hypokinesis elsewhere for fairly normal lateral wall, small apical thrombus associated with akinetic segment.  Segment was noted.  He did have a minimal free-flowing pericardial effusion posterior to the heart. There was a small and partially organized effusion anterior to the RV with no evidence for tamponade.  SOCIAL HISTORY:  The patient lives in [place] with his wife.  He is married with 3 children.  He has a remote history of tobacco use, quit 50 years ago.  Negative for EtOH or drugs.  FAMILY HISTORY:  Mother deceased from CVA.  Father deceased from Alzheimer.  CURRENT MEDICATIONS PRIOR TO ADMISSION: 1. Coreg 25 mg b.i.d. 2. Doxycycline 100 mg b.i.d. 3. Lisinopril 20 mg at bedtime. 4. Coumadin per PT/INR. 5. Oxybutynin 5 mg daily.  ALLERGIES:  No known drug allergies.  CURRENT LABORATORY DATA:  Sodium 134, potassium 4.0, chloride 102, CO2 27,  BUN 24, creatinine 0.99, glucose 93.  Hemoglobin 12.4, hematocrit 38.7, white blood cells 12.2, platelets 226.  PT 35.2, INR 3.4.  RADIOLOGY:  CT scan of the pelvis revealing expanded rectus sheath associated with stranding and foci of IV contrast consistent with active hemorrhage.  PHYSICAL EXAMINATION:  VITAL SIGNS:  Blood pressure 132/70, pulse 76, respirations 20, temperature 97.6 and weight 88.1 kg. GENERAL:  He is awake, alert, oriented with no severe complaint and has some soreness in his left groin. HEENT:  Head is normocephalic and atraumatic.  Eyes, PERRLA. NECK:  Supple without carotid bruits, JVD or thyromegaly. CARDIOVASCULAR:  Regular rate and rhythm with 1/6 systolic murmur. LUNGS:  Clear to auscultation  without wheezes, rales or rhonchi. ABDOMEN:  Soft.  He does have some ecchymosis noted in the right groin and left testicular area and some discomfort with palpation of the left lower quadrant. EXTREMITIES:  Without clubbing, cyanosis.  He does have some 1+ edema noted in the left leg. MUSCULOSKELETAL:  No joint deformity. NEURO:  Cranial nerves II through XII are grossly intact.  IMPRESSION: 1. Acute left rectal sheath bleed on Coumadin, INR 3.4 on admit with     FFP and vitamin K given.  Obviously, hold Coumadin for now, low     risk to LV clot to stop Coumadin at this time, not certain that it     actually should be restarted with this rectus sheath bleeding and     history of gastrointestinal bleed.  We will follow making more     recommendations per Dr. Diona Browner. 2. Tachyarrhythmias.  We will repeat EKG as auscultated regular rhythm     now. 3. Systolic dysfunction.  No evidence of fluid overload at present.  On behalf of the physicians and providers of [facility], we would like to thank the [facility] service and Dr. Lilyan Punt for allowing [place] to participate in the care of this patient.     Bettey Mare. Lyman Bishop, NP   ______________________________ Jonelle Sidle, MD    KML/MEDQ  D:  01/22/2011  T:  01/22/2011  Job:  [id number]  cc:   Gerrit Friends. Dietrich Pates, MD, [facility] [address] [place], [place] [address]  [name], MD Fax: [phone]  Electronically Signed by Joni Reining NP on 02/08/2011 08:48:45 AM Electronically Signed by Nona Dell MD on 02/09/2011 04:42:05 PM

## 2011-02-12 ENCOUNTER — Encounter: Payer: Self-pay | Admitting: Adult Health

## 2011-03-03 ENCOUNTER — Encounter (HOSPITAL_COMMUNITY): Payer: Medicare Other

## 2011-03-10 ENCOUNTER — Encounter (HOSPITAL_COMMUNITY): Payer: Medicare Other

## 2011-03-30 NOTE — Consult Note (Signed)
Stephen Martin, Stephen Martin                ACCOUNT NO.:  [id number]  MEDICAL RECORD NO.:  [id number]  LOCATION:  IC07                          FACILITY:  [facility]  PHYSICIAN:  Jonelle Sidle, MD DATE OF BIRTH:  14-Dec-1930  DATE OF CONSULTATION:  01/13/2011 DATE OF DISCHARGE:                                CONSULTATION   PRIMARY CARDIOLOGIST:  Gerrit Friends. Dietrich Pates, MD, [facility]  PRIMARY MD:  Jonna Coup Gerda Diss, MD  HISTORY OF PRESENT ILLNESS:  This is a 77 year old white male patient who was admitted with fever, chills, and lightheadedness on January 11, 2011, after being admitted with similar symptoms on January 09, 2011 and treated for UTI.  He has had a Port-A-Cath since 2005 for small cell lung CA and this was removed yesterday as a possible source of infection.  A CT angio of his chest shows small left pleural effusion and possible pneumonia on the left side and a stable pericardial effusion.  He has had sinus tachycardia and atrial fibrillation, SVT, and runs of nonsustained V-tach.  Tachycardias are as fast as 160-180 beats per minute.  A 2-D echo on January 11, 2011, showed mild LVH, moderate- to-severe LV dysfunction, ejection fraction 30-35% with severe hypokinesis of the anteroseptal, apical, inferior, and posterior walls. Other walls were hypokinetic except for the lateral wall was normal.  He has small apical thrombus.  This is all new.  Prior echo in 2008 with normal LV function with mild LVH.  The patient does admit to 52-month history of dyspnea on exertion when walking from his barn to his home.  He denies any associated chest pain, palpitations, dizziness, or presyncope.  His wife says he does stagger a lot and is off balancing and has had multiple falls recently.  ALLERGIES:  No known drug allergies.  MEDICATIONS:  Currently, he is on carvedilol 6.25 mg b.i.d., doxycycline, levofloxacin, and cefepime.  He is on lisinopril 10 mg at bedtime and albuterol.  PAST MEDICAL HISTORY:   Significant for small cell lung CA with Port-A- Cath since 2005, removed yesterday.  He has a history of paroxysmal atrial flutter, thrombocytopenia, history of GI bleed in 2007, TURP in 2008.  He had a cardiac cath in 2004 because of an abnormal stress test showing mild coronary artery disease, 50% LAD.  He had atrial fib in 2007.  SOCIAL HISTORY:  He lives in [place].  He is married with 3 children. He has no remote history of tobacco abuse, has not smoked for 50 years.  FAMILY HISTORY:  Mother died of CVA.  Father died of Alzheimer's.  No history of coronary artery disease.  REVIEW OF SYSTEMS:  Significant for staggering and off balance and recent falls.  He denies any dizziness or presyncopal signs or symptoms. He denies palpitations or chest pain.  Please see HPI for details.  All other systems reviewed and are negative.  PHYSICAL EXAMINATION:  GENERAL:  This is a pleasant 77 year old white male in no acute distress. HEENT:  Head is normocephalic without sign of trauma.  Extraocular movements are intact.  Pupils are equal and reactive to light and accommodation.  Nasal mucosa is moist.  Throat is without erythema  or exudate. NECK:  Supple without JVD, HJR, bruit, or thyroid enlargement. LUNGS:  Decreased breath sounds, right greater than left with scattered rhonchi at the bases. HEART:  Regular rate and rhythm at 60 beats per minute.  Normal S1 and S2 with a 1/6 systolic murmur at the apex. ABDOMEN:  Soft without organomegaly, masses, lesions, or abnormal tenderness. EXTREMITIES:  Without cyanosis, clubbing, or edema.  He has good distal pulses. NEUROLOGIC:  Without focal deficit.  Chest x-ray; low-volume chest with bilateral basilar airspace disease, suspicious multifocal pneumonia or asymmetric pulmonary edema.  EKG on January 10, 2011, at 7829 which showed normal sinus rhythm.  At 1624, on the same date showed sinus tach at 177 beats per minute.  TSH is elevated at 8.21,  free T4 is normal.  D dimer is up at 2.43.  He has [facility] spotted fever IgM was normal, but IgG is elevated at 0.81.  IMPRESSION: 1. Systemic inflammatory response syndrome, etiology unclear. 2. Moderate-to-severe left ventricular dysfunction, ejection fraction     30-35% with wall motion abnormalities.  This is new since 2008. 3. Paroxysmal atrial fibrillation with rapid ventricular response as     well as sinus tachycardia and nonsustained ventricular tachycardia,     currently in normal sinus rhythm. 4. Stage III small cell lung carcinoma in 2005. 5. Port-A-Cath removed yesterday that has been in since 2005. 6. Thrombocytopenia. 7. Recent falls. 8. History of nonobstructive coronary artery disease and cath in 2004     after an abnormal stress test.  At that time, he had a 50% left     anterior descending.  PLAN:  We will continue to follow along with you and treat of his arrhythmias.  We will increase his beta-blocker.  We will consider stress testing in light of his new left ventricular dysfunction and multiple wall motion abnormalities once his fever and inflammatory response syndrome resolve.  Thank you for the consult.  We will follow along with you.     Jacolyn Reedy, PA-C   ______________________________ Jonelle Sidle, MD    ML/MEDQ  D:  01/13/2011  T:  01/14/2011  Job:  [id number]  Electronically Signed by Jacolyn Reedy PA-C on 03/24/2011 09:16:27 AM Electronically Signed by Nona Dell MD on 03/30/2011 09:54:03 PM

## 2011-04-14 ENCOUNTER — Encounter (HOSPITAL_COMMUNITY): Payer: Medicare Other

## 2011-04-21 ENCOUNTER — Encounter (HOSPITAL_COMMUNITY): Payer: Medicare Other

## 2011-04-29 ENCOUNTER — Encounter: Payer: Self-pay | Admitting: Cardiology

## 2011-04-30 ENCOUNTER — Ambulatory Visit: Payer: Medicare Other | Admitting: Cardiology

## 2011-04-30 ENCOUNTER — Encounter: Payer: Self-pay | Admitting: Cardiology

## 2011-04-30 DIAGNOSIS — R0989 Other specified symptoms and signs involving the circulatory and respiratory systems: Secondary | ICD-10-CM | POA: Insufficient documentation

## 2011-04-30 DIAGNOSIS — I429 Cardiomyopathy, unspecified: Secondary | ICD-10-CM | POA: Insufficient documentation

## 2011-04-30 DIAGNOSIS — I472 Ventricular tachycardia: Secondary | ICD-10-CM | POA: Insufficient documentation

## 2011-04-30 DIAGNOSIS — I4729 Other ventricular tachycardia: Secondary | ICD-10-CM | POA: Insufficient documentation

## 2011-04-30 DIAGNOSIS — C3490 Malignant neoplasm of unspecified part of unspecified bronchus or lung: Secondary | ICD-10-CM | POA: Insufficient documentation

## 2011-04-30 DIAGNOSIS — S301XXA Contusion of abdominal wall, initial encounter: Secondary | ICD-10-CM | POA: Insufficient documentation

## 2011-05-04 LAB — COMPREHENSIVE METABOLIC PANEL
Albumin: 3.3 — ABNORMAL LOW
BUN: 11
Calcium: 9.1
Glucose, Bld: 90
Potassium: 3.8
Sodium: 138
Total Protein: 6.8

## 2011-05-04 LAB — DIFFERENTIAL
Lymphs Abs: 1.5
Monocytes Absolute: 0.7
Monocytes Relative: 8
Neutro Abs: 6.1
Neutrophils Relative %: 71

## 2011-05-04 LAB — CBC
HCT: 39.7
Hemoglobin: 13.3
MCHC: 33.5
Platelets: 169
RDW: 16.3 — ABNORMAL HIGH

## 2011-05-07 LAB — BASIC METABOLIC PANEL
BUN: 17
CO2: 24
Chloride: 107
GFR calc non Af Amer: >60
Glucose, Bld: 84
Potassium: 3.6
Sodium: 138

## 2011-05-10 LAB — BASIC METABOLIC PANEL
CO2: 28
Calcium: 8.7
Chloride: 105
GFR calc Af Amer: >60
Potassium: 3.6
Sodium: 136

## 2011-05-18 LAB — DIFFERENTIAL
Basophils Relative: 0
Eosinophils Absolute: 0.2
Lymphs Abs: 1.3
Neutrophils Relative %: 71

## 2011-05-18 LAB — COMPREHENSIVE METABOLIC PANEL
ALT: 16
Alkaline Phosphatase: 85
BUN: 13
CO2: 28
Calcium: 8.2 — ABNORMAL LOW
GFR calc non Af Amer: >60
Glucose, Bld: 90
Sodium: 136

## 2011-05-18 LAB — CBC
HCT: 36.8 — ABNORMAL LOW
Hemoglobin: 12 — ABNORMAL LOW
MCHC: 32.7
RBC: 4.39

## 2011-05-24 LAB — DIFFERENTIAL
Basophils Absolute: 0
Eosinophils Absolute: 0.1
Eosinophils Relative: 2

## 2011-05-24 LAB — CBC
RBC: 4.45
WBC: 8.1

## 2011-05-24 LAB — COMPREHENSIVE METABOLIC PANEL
ALT: 14
AST: 22
CO2: 28
Chloride: 106
GFR calc Af Amer: >60
GFR calc non Af Amer: >60
Sodium: 140
Total Bilirubin: 0.6

## 2011-05-26 ENCOUNTER — Encounter (HOSPITAL_COMMUNITY): Payer: Medicare Other | Attending: Oncology

## 2011-05-26 ENCOUNTER — Other Ambulatory Visit (HOSPITAL_COMMUNITY): Payer: Self-pay | Admitting: Oncology

## 2011-05-26 DIAGNOSIS — C3430 Malignant neoplasm of lower lobe, unspecified bronchus or lung: Secondary | ICD-10-CM

## 2011-05-26 DIAGNOSIS — C3490 Malignant neoplasm of unspecified part of unspecified bronchus or lung: Secondary | ICD-10-CM

## 2011-05-26 LAB — DIFFERENTIAL
Basophils Absolute: 0 10*3/uL (ref 0.0–0.1)
Lymphs Abs: 1.8 10*3/uL (ref 0.7–4.0)
Monocytes Relative: 7 % (ref 3–12)

## 2011-05-26 LAB — COMPREHENSIVE METABOLIC PANEL
ALT: 12 U/L (ref 0–53)
AST: 15 U/L (ref 0–37)
Albumin: 3.2 g/dL — ABNORMAL LOW (ref 3.5–5.2)
Alkaline Phosphatase: 85 U/L (ref 39–117)
BUN: 13 mg/dL (ref 6–23)
CO2: 27 mEq/L (ref 19–32)
Calcium: 9 mg/dL (ref 8.4–10.5)
Chloride: 103 mEq/L (ref 96–112)
Creatinine, Ser: 1.04 mg/dL (ref 0.50–1.35)
GFR calc Af Amer: 77 mL/min — ABNORMAL LOW (ref >90)
GFR calc non Af Amer: 67 mL/min — ABNORMAL LOW (ref >90)
Glucose, Bld: 90 mg/dL (ref 70–99)
Potassium: 3.7 mEq/L (ref 3.5–5.1)
Sodium: 137 mEq/L (ref 135–145)
Total Bilirubin: 0.4 mg/dL (ref 0.3–1.2)
Total Protein: 7 g/dL (ref 6.0–8.3)

## 2011-05-26 LAB — CBC
MCV: 89.8 fL (ref 78.0–100.0)
Platelets: 164 10*3/uL (ref 150–400)
RDW: 14 % (ref 11.5–15.5)
WBC: 11.4 10*3/uL — ABNORMAL HIGH (ref 4.0–10.5)

## 2011-05-26 NOTE — Progress Notes (Signed)
Stephen Martin presented for labwork. Labs per MD order drawn via Peripheral Line 23 gauge needle inserted in left AC  Good blood return present. Procedure without incident.  Needle removed intact. Patient tolerated procedure well.

## 2011-05-27 LAB — CBC
HCT: 36.3 — ABNORMAL LOW
Hemoglobin: 12.3 — ABNORMAL LOW
MCHC: 33.8
MCV: 82.6
RBC: 4.4

## 2011-05-27 LAB — COMPREHENSIVE METABOLIC PANEL
BUN: 19
CO2: 27
Calcium: 8.7
Creatinine, Ser: 1.11
GFR calc non Af Amer: >60
Glucose, Bld: 112 — ABNORMAL HIGH
Total Bilirubin: 0.6

## 2011-06-02 ENCOUNTER — Encounter (HOSPITAL_COMMUNITY): Payer: Medicare Other

## 2011-06-11 ENCOUNTER — Ambulatory Visit (HOSPITAL_COMMUNITY)
Admission: RE | Admit: 2011-06-11 | Discharge: 2011-06-11 | Disposition: A | Discharge status: Home / Self Care | Payer: Medicare Other | Source: Ambulatory Visit | Attending: Oncology | Admitting: Oncology

## 2011-06-11 DIAGNOSIS — J90 Pleural effusion, not elsewhere classified: Secondary | ICD-10-CM | POA: Insufficient documentation

## 2011-06-11 DIAGNOSIS — C3490 Malignant neoplasm of unspecified part of unspecified bronchus or lung: Secondary | ICD-10-CM | POA: Insufficient documentation

## 2011-06-14 ENCOUNTER — Encounter (HOSPITAL_COMMUNITY): Payer: Self-pay | Admitting: Oncology

## 2011-06-14 ENCOUNTER — Encounter (HOSPITAL_COMMUNITY): Payer: Medicare Other | Attending: Oncology | Admitting: Oncology

## 2011-06-14 VITALS — BP 129/78 | HR 74 | Temp 97.7°F | Wt 202.4 lb

## 2011-06-14 DIAGNOSIS — C3410 Malignant neoplasm of upper lobe, unspecified bronchus or lung: Secondary | ICD-10-CM

## 2011-06-14 DIAGNOSIS — C3490 Malignant neoplasm of unspecified part of unspecified bronchus or lung: Secondary | ICD-10-CM | POA: Insufficient documentation

## 2011-06-14 DIAGNOSIS — C4490 Unspecified malignant neoplasm of skin, unspecified: Secondary | ICD-10-CM

## 2011-06-14 DIAGNOSIS — N400 Enlarged prostate without lower urinary tract symptoms: Secondary | ICD-10-CM | POA: Insufficient documentation

## 2011-06-14 HISTORY — DX: Unspecified malignant neoplasm of skin, unspecified: C44.90

## 2011-06-14 HISTORY — DX: Benign prostatic hyperplasia without lower urinary tract symptoms: N40.0

## 2011-06-14 NOTE — Progress Notes (Signed)
Stephen Asa, MD, MD [address] [place] [address]  1. Carcinoma, lung  ALPRAZolam (XANAX) 0.5 MG tablet, CBC, Differential, Comprehensive metabolic panel, CT Chest Wo Contrast  2. Multiple Skin cancer    3. BPH (benign prostatic hypertrophy)      CURRENT THERAPY: S/P concomitant radiation and chemotherapy, followed by post-radiation chemotherapy finishing all as of 04/25/2006.  INTERVAL HISTORY: Stephen Martin 77 y.o. male returns for  regular  visit for followup of Stage IIIA non-small cell lung cancer.  The patient denies any complaints.  He Questions whether I can give him a cream for his facial lesions which appear to be seborrheic keratoses, but I explained to the patient that I would prefer to defer to to his dermatologist.    The patient reports that he has been feeling well.  When asked, "If there is one thing I could fix today, what would it be," he replies that there is nothing to fix.  The patient denies any fevers, chills, night sweats, diarrhea, constipation, bowel and urinary difficulties.  I personally reviewed and went over radiographic studies with the patient.  I personally reviewed and went over laboratory results with the patient.   Past Medical History  Diagnosis Date  . Hypertension   . Carcinoma, lung     Left upper lobe; stage III; treated with RT and chemotherapy  . Arteriosclerotic cardiovascular disease (ASCVD)     Echocardiogram in 2008: Mild LVH with normal EF; cardiac cath in 09/2005-nonobstructive coronary disease with 50% LAD  . Nonsustained ventricular tachycardia   . Carotid bruit     Right; Carotid ultrasound in 08/2006: scattered atherosclerotic plaque without significant focal disease  . Paroxysmal atrial flutter     Apical left ventricular thrombus and 01/2011  . Chronic anticoagulation   . Abdominal wall hematoma     Admitted 01/2011; supratherapeutic INR  . Cardiomyopathy     EF of 35% in 01/2011  . Upper GI bleed   .  Multiple Skin cancer 06/14/2011  . BPH (benign prostatic hypertrophy) 06/14/2011    has HYPERTENSION; Arteriosclerotic cardiovascular disease (ASCVD); Chronic anticoagulation; Atrial fibrillation; Non-small cell lung cancer; Nonsustained ventricular tachycardia; Carotid bruit; Cardiomyopathy; Abdominal wall hematoma; Multiple Skin cancer; and BPH (benign prostatic hypertrophy) on his problem list.      has no known allergies.  Stephen Martin does not currently have medications on file.  Past Surgical History  Procedure Date  . Inguinal hernia repair   . Cholecystectomy   . Laparoscopic gastrotomy w/ repair of ulcer     Denies any headaches, dizziness, double vision, fevers, chills, night sweats, nausea, vomiting, diarrhea, constipation, chest pain, heart palpitations, shortness of breath, blood in stool, black tarry stool, urinary pain, urinary burning, urinary frequency, hematuria.   PHYSICAL EXAMINATION  ECOG PERFORMANCE STATUS: 1 - Symptomatic but completely ambulatory  Filed Vitals:   06/14/11 1522  BP: 129/78  Pulse: 74  Temp: 97.7 F (36.5 C)    GENERAL:alert, no distress, well nourished, well developed, comfortable, cooperative, smiling and face disfigurement from skin cancer resection SKIN: multiple skin lesions including seborrheic keratoses. HEAD: disfigured from multiple skin cancer resections EYES: normal EARS: left ear disfigured from skin cancer resection surgeries OROPHARYNX:mucous membranes are moist  NECK: supple, no adenopathy, no bruits, thyroid normal size, non-tender, without nodularity, no stridor, non-tender, trachea midline LYMPH:  no palpable lymphadenopathy, no hepatosplenomegaly BREAST:not examined LUNGS: clear to auscultation and percussion HEART: regular rate & rhythm, no murmurs, no gallops, S1 normal  and S2 normal ABDOMEN:abdomen soft, non-tender, normal bowel sounds and no hepatosplenomegaly BACK: Back symmetric, no curvature. EXTREMITIES:less then  2 second capillary refill, no joint deformities, effusion, or inflammation, no edema, no clubbing, no cyanosis  NEURO: alert & oriented x 3 with fluent speech, no focal motor/sensory deficits, gait normal   LABORATORY DATA: CBC    Component Value Date/Time   WBC 11.4* 05/26/2011 1515   RBC 4.42 05/26/2011 1515   HGB 12.7* 05/26/2011 1515   HCT 39.7 05/26/2011 1515   PLT 164 05/26/2011 1515   MCV 89.8 05/26/2011 1515   MCH 28.7 05/26/2011 1515   MCHC 32.0 05/26/2011 1515   RDW 14.0 05/26/2011 1515   LYMPHSABS 1.8 05/26/2011 1515   MONOABS 0.8 05/26/2011 1515   EOSABS 3.2* 05/26/2011 1515   BASOSABS 0.0 05/26/2011 1515      RADIOGRAPHIC STUDIES:  06/11/2011  *RADIOLOGY REPORT*  Clinical Data: Lung cancer  CT CHEST WITHOUT CONTRAST  Technique: Multidetector CT imaging of the chest was performed  following the standard protocol without IV contrast.  Comparison: 01/13/2011  Findings:  There is mild multilevel thoracic spondylosis.  No enlarged axillary or supraclavicular lymph nodes.  No pathologically enlarged mediastinal or hilar lymph nodes.  Decrease in volume of small pericardial effusion.  There is a left pleural effusion which is decreased in volume from  previous exam.  Left lung peri hilar and paramediastinal fibrosis, consolidation  and volume loss is identified consistent with changes of external  beam radiation.  No pulmonary parenchymal nodules or masses identified.  Review of the visualized osseous structures is unremarkable.  No worrisome lytic or sclerotic lesions identified.  IMPRESSION:  1. There are no specific features identified to suggest residual or  recurrent tumor.  2. Radiation change within the left lung.  3. Left pleural effusion.  4. Decrease in volume of pericardial effusion.  Original Report Authenticated By: Rosealee Albee, [name]     ASSESSMENT:  1. Stage IIIA non-small cell lung cancer versus a metastatic lesion from head and neck  areas from skin cancer, though it is suspected to be from lung primary. 2. Numerous skin cancers   PLAN:  1. Lab work in 1 year: CBC diff, CMET 2. CT of chest without contrast in 1 year for surveillance 3. I personally reviewed and went over laboratory results with the patient. 4. I personally reviewed and went over radiographic studies with the patient. 5. Return to the clinic in 1 year for follow-up.   All questions were answered. The patient knows to call the clinic with any problems, questions or concerns. We can certainly see the patient much sooner if necessary.  The patient and plan discussed with Glenford Peers, MD and he is in agreement with the aforementioned.   Stephen Martin

## 2011-06-14 NOTE — Patient Instructions (Signed)
[facility]  Discharge Instructions  RECOMMENDATIONS MADE BY THE CONSULTANT AND ANY TEST RESULTS WILL BE SENT TO YOUR REFERRING DOCTOR.   EXAM FINDINGS BY MD TODAY AND SIGNS AND SYMPTOMS TO REPORT TO CLINIC OR PRIMARY MD: you are doing well.  Report any blood in your sputum, cough, increased shortness of breath, etc.  MEDICATIONS PRESCRIBED: none     SPECIAL INSTRUCTIONS/FOLLOW-UP: Xray Studies Needed in 1 year and Return to Clinic in 1 year.   I acknowledge that I have been informed and understand all the instructions given to me and received a copy. I do not have any more questions at this time, but understand that I may call the Specialty Clinic at [facility] at [phone] during business hours should I have any further questions or need assistance in obtaining follow-up care.    __________________________________________  _____________  __________ Signature of Patient or Authorized Representative            Date                   Time    __________________________________________ Nurse's Signature

## 2011-06-17 ENCOUNTER — Ambulatory Visit: Payer: Self-pay | Admitting: *Deleted

## 2011-06-17 ENCOUNTER — Telehealth: Payer: Self-pay | Admitting: Cardiology

## 2011-06-17 DIAGNOSIS — I8290 Acute embolism and thrombosis of unspecified vein: Secondary | ICD-10-CM

## 2011-06-17 DIAGNOSIS — I4891 Unspecified atrial fibrillation: Secondary | ICD-10-CM

## 2011-06-17 DIAGNOSIS — Z7901 Long term (current) use of anticoagulants: Secondary | ICD-10-CM

## 2011-06-17 NOTE — Telephone Encounter (Signed)
Patient no longer takes coumadin.

## 2011-07-07 ENCOUNTER — Other Ambulatory Visit: Payer: Self-pay | Admitting: *Deleted

## 2011-07-07 DIAGNOSIS — I4891 Unspecified atrial fibrillation: Secondary | ICD-10-CM

## 2011-07-07 DIAGNOSIS — I10 Essential (primary) hypertension: Secondary | ICD-10-CM

## 2011-07-07 MED ORDER — CARVEDILOL 25 MG PO TABS: 25.0000 mg | ORAL_TABLET | Freq: Two times a day (BID) | ORAL | Status: DC

## 2011-09-09 ENCOUNTER — Ambulatory Visit (INDEPENDENT_AMBULATORY_CARE_PROVIDER_SITE_OTHER): Payer: Medicare Other | Admitting: Otolaryngology

## 2011-09-09 DIAGNOSIS — H7010 Chronic mastoiditis, unspecified ear: Secondary | ICD-10-CM

## 2011-09-09 DIAGNOSIS — H9500 Recurrent cholesteatoma of postmastoidectomy cavity, unspecified ear: Secondary | ICD-10-CM

## 2011-09-14 ENCOUNTER — Other Ambulatory Visit: Payer: Self-pay | Admitting: Dermatology

## 2011-09-23 ENCOUNTER — Ambulatory Visit (INDEPENDENT_AMBULATORY_CARE_PROVIDER_SITE_OTHER): Payer: Medicare Other | Admitting: Otolaryngology

## 2011-09-23 DIAGNOSIS — H7010 Chronic mastoiditis, unspecified ear: Secondary | ICD-10-CM

## 2012-03-07 ENCOUNTER — Other Ambulatory Visit: Payer: Self-pay | Admitting: Family Medicine

## 2012-03-07 DIAGNOSIS — R29898 Other symptoms and signs involving the musculoskeletal system: Secondary | ICD-10-CM

## 2012-03-07 DIAGNOSIS — C3490 Malignant neoplasm of unspecified part of unspecified bronchus or lung: Secondary | ICD-10-CM

## 2012-03-13 ENCOUNTER — Other Ambulatory Visit (HOSPITAL_COMMUNITY): Payer: Medicare Other

## 2012-03-14 ENCOUNTER — Ambulatory Visit (HOSPITAL_COMMUNITY): Admission: RE | Admit: 2012-03-14 | Payer: Medicare Other | Source: Ambulatory Visit

## 2012-04-03 IMAGING — CT CT ABD-PELV W/ CM
2 of 5 series · 15 of 46 positions shown, 17 images · IV contrast (Omnipaque 300)
Comparison: 07/24/2009

CLINICAL DATA: Bleeding.  Stroke Coumadin 1 week ago for irregular
heart rate.  Bruise suprapubic region, base of penis.  Left lower
quadrant pain.  No known injury.  History of hypertension, prostate
cancer, TURP.

CT ABDOMEN AND PELVIS WITH CONTRAST
TECHNIQUE: Multidetector CT imaging of the abdomen and pelvis was
performed following the standard protocol during bolus
administration of intravenous contrast.
Contrast: 100 ml Jmnipaque-ODD

[Series 4: abd_pel_with 3.0 spo · coronal · 0.70mm/px · 3 of 89 slices shown]
[im 30/89  soft-tissue]
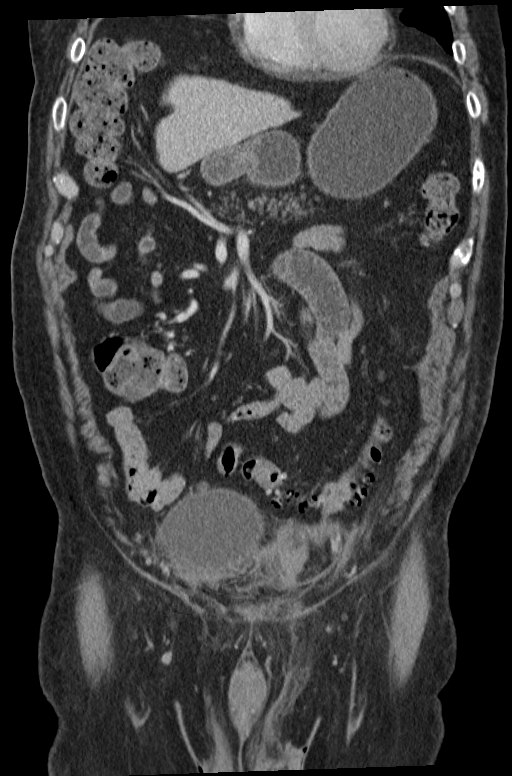
[im 40/89  soft-tissue]
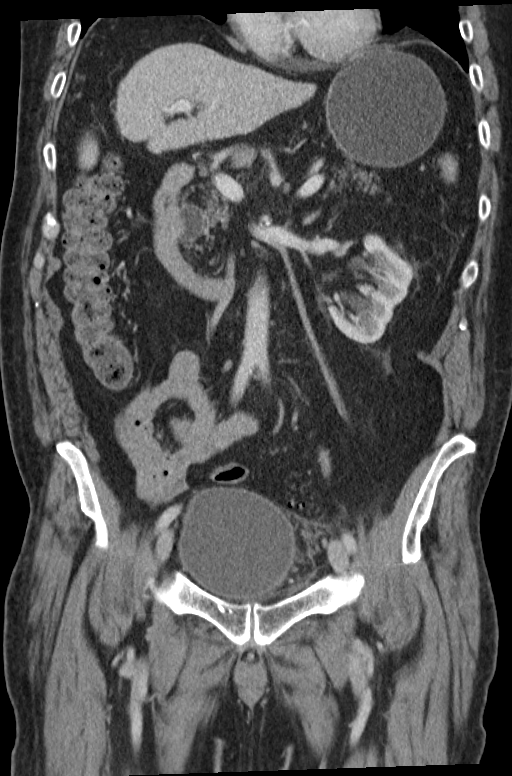
[im 49/89  soft-tissue]
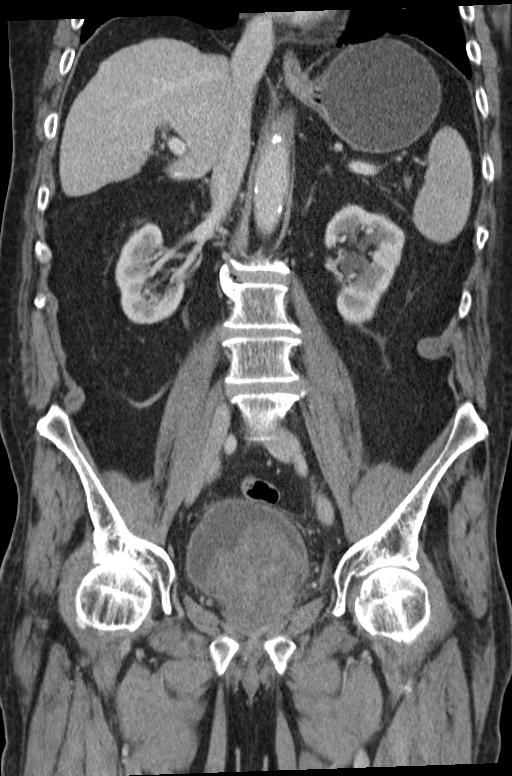

[Series 7: delay kidney · axial · delayed · 0.73mm/px · z∈[-516,-42]mm · 12 of 111 slices shown, 14 images]
[im 8/111  soft-tissue]
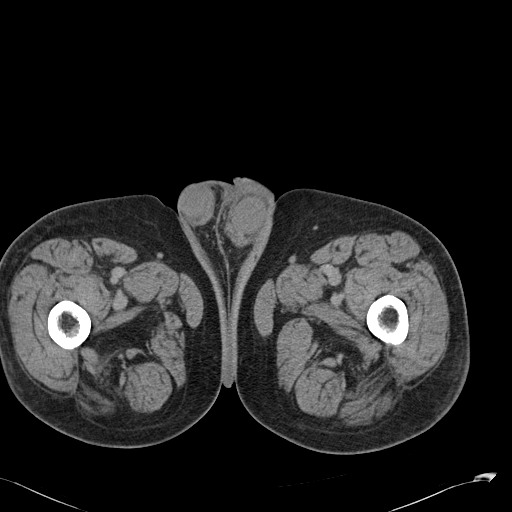
[im 8/111  bone]
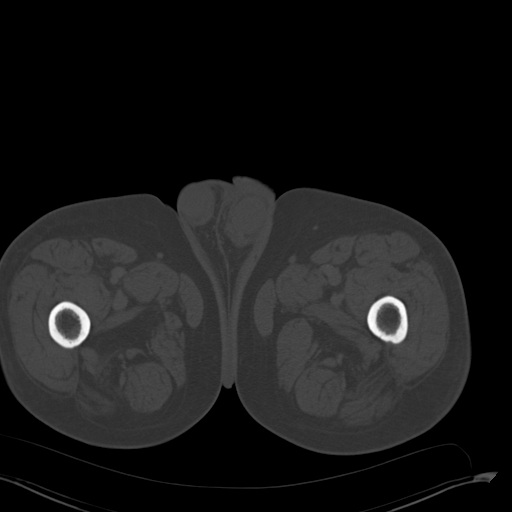
[im 16/111  soft-tissue]
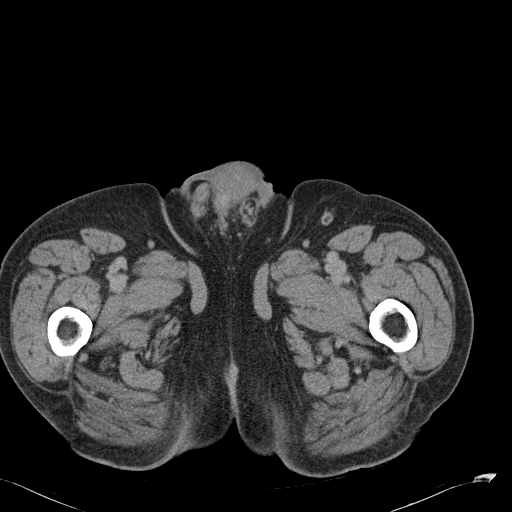
[im 24/111  soft-tissue]
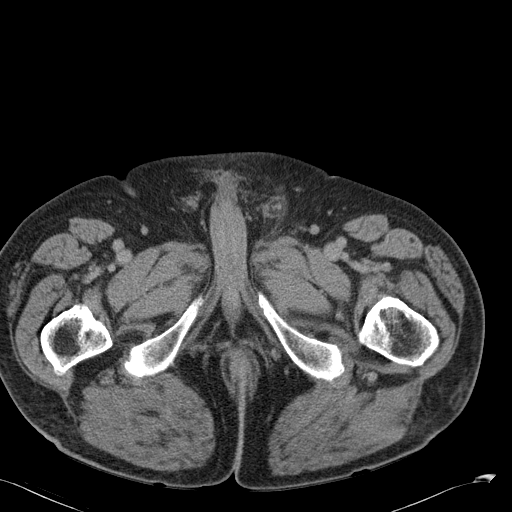
[im 32/111  soft-tissue]
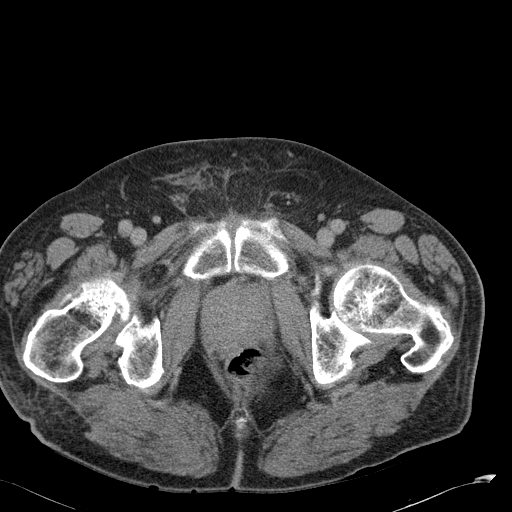
[im 40/111  soft-tissue]
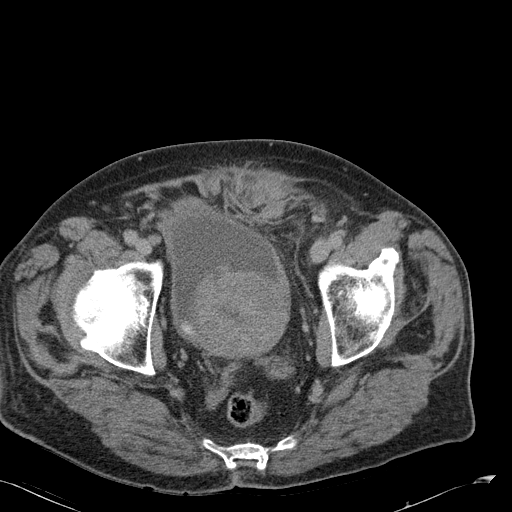
[im 48/111  soft-tissue]
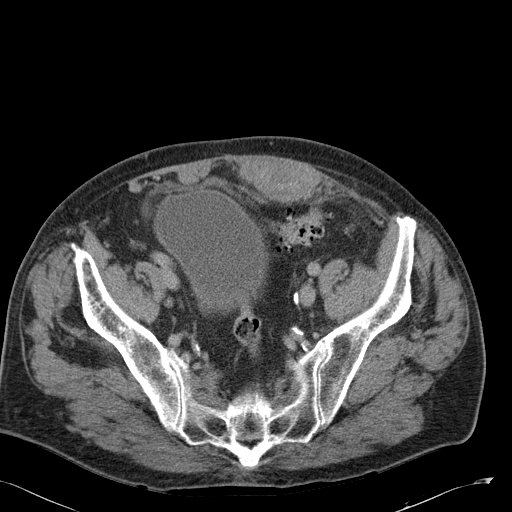
[im 63/111  soft-tissue]
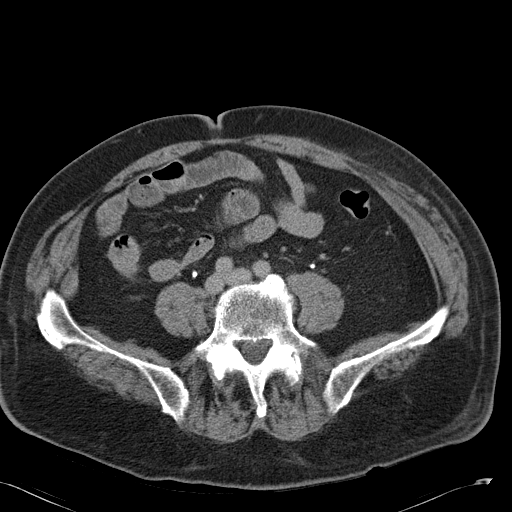
[im 71/111  soft-tissue]
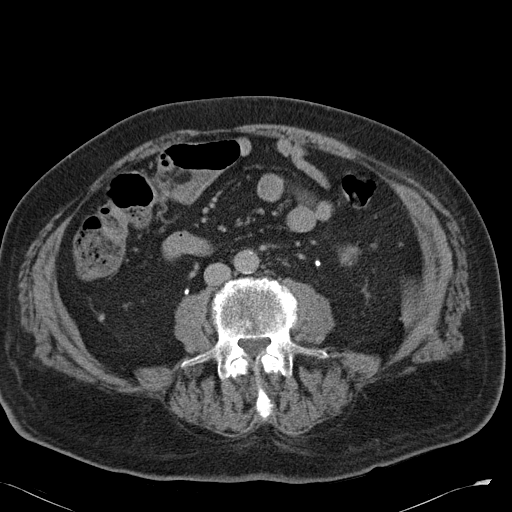
[im 79/111  soft-tissue]
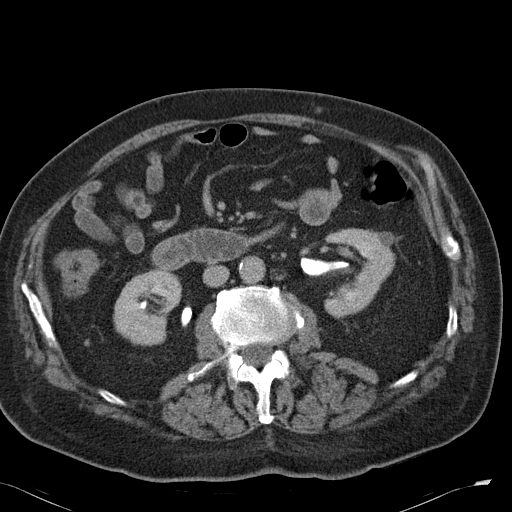
[im 79/111  bone]
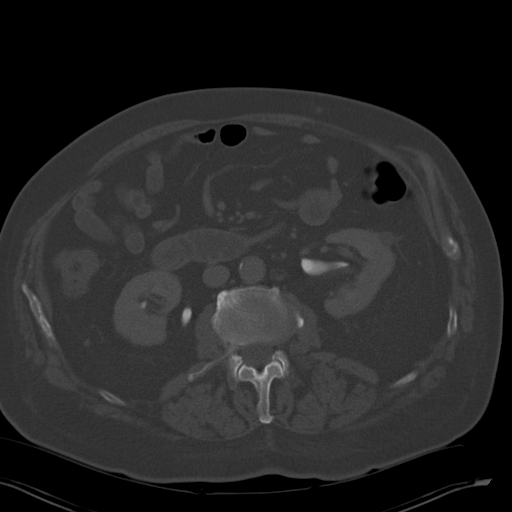
[im 87/111  soft-tissue]
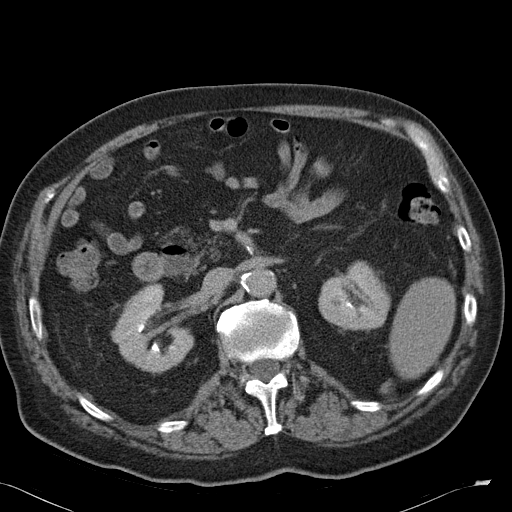
[im 95/111  soft-tissue]
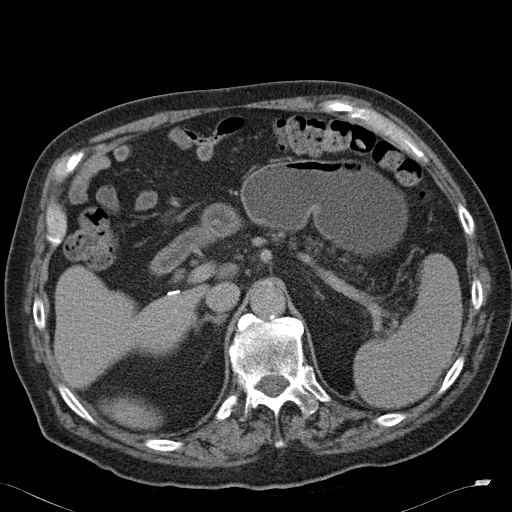
[im 103/111  soft-tissue]
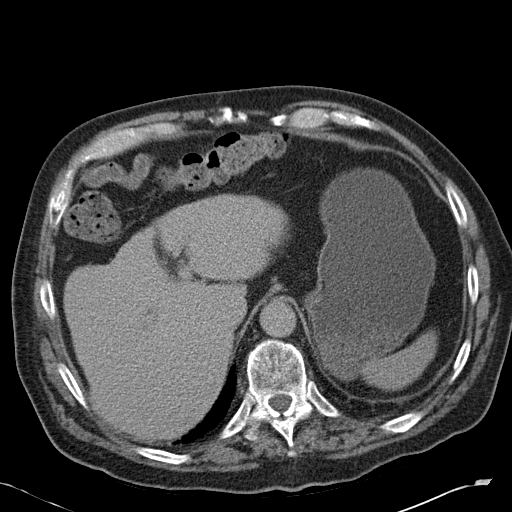

[15 of 46 positions shown; findings below may reference images not displayed]

FINDINGS: There is a small left pleural effusion.  Bibasilar
scarring identified.  A trace pericardial effusion is present.

The patient has had prior cholecystectomy.  There is fatty
replacement of the pancreas.  No focal abnormality is identified
within the liver or spleen.  Left renal cysts are present.  Right
kidney shows no focal lesion.

There are numerous colonic diverticula but no CT evidence for acute
diverticulitis.  The stomach and small bowel loops have a normal
appearance.

Within the suprapubic region, the left rectus muscle is expanded
and strandy, consistent with hematoma.  Small punctate areas of
high attenuation are identified, suggesting ongoing bleeding.

The urinary bladder is distended.  The prostate gland is enlarged
with TURP defect.  No pelvic adenopathy.
IMPRESSION: 1.  Expanded rectus sheath associated stranding and foci of
intravenous contrast, consistent with active hemorrhage.
2.  Trace pericardial effusion.
3.  Status post cholecystectomy.
4.  Diverticulosis without evidence for acute diverticulitis.

Critical test results telephoned to Dr. Ferman at the time of
interpretation on date 01/22/2011 at time [DATE] a.m.

## 2012-04-24 ENCOUNTER — Ambulatory Visit (HOSPITAL_COMMUNITY)
Admission: RE | Admit: 2012-04-24 | Discharge: 2012-04-24 | Disposition: A | Discharge status: Home / Self Care | Payer: Medicare Other | Source: Ambulatory Visit | Attending: Family Medicine | Admitting: Family Medicine

## 2012-04-24 DIAGNOSIS — R29898 Other symptoms and signs involving the musculoskeletal system: Secondary | ICD-10-CM

## 2012-04-24 DIAGNOSIS — J329 Chronic sinusitis, unspecified: Secondary | ICD-10-CM | POA: Insufficient documentation

## 2012-04-24 DIAGNOSIS — C3490 Malignant neoplasm of unspecified part of unspecified bronchus or lung: Secondary | ICD-10-CM | POA: Insufficient documentation

## 2012-04-24 DIAGNOSIS — G319 Degenerative disease of nervous system, unspecified: Secondary | ICD-10-CM | POA: Insufficient documentation

## 2012-04-24 DIAGNOSIS — R5381 Other malaise: Secondary | ICD-10-CM | POA: Insufficient documentation

## 2012-05-01 ENCOUNTER — Other Ambulatory Visit: Payer: Self-pay | Admitting: Dermatology

## 2012-06-13 ENCOUNTER — Ambulatory Visit (HOSPITAL_COMMUNITY)
Admission: RE | Admit: 2012-06-13 | Discharge: 2012-06-13 | Disposition: A | Discharge status: Home / Self Care | Payer: Medicare Other | Source: Ambulatory Visit | Attending: Oncology | Admitting: Oncology

## 2012-06-13 ENCOUNTER — Encounter (HOSPITAL_COMMUNITY): Payer: Medicare Other | Attending: Oncology | Admitting: Oncology

## 2012-06-13 ENCOUNTER — Encounter (HOSPITAL_COMMUNITY): Payer: Self-pay | Admitting: Oncology

## 2012-06-13 VITALS — BP 144/87 | HR 92 | Temp 97.4°F | Resp 18 | Wt 188.5 lb

## 2012-06-13 DIAGNOSIS — C3490 Malignant neoplasm of unspecified part of unspecified bronchus or lung: Secondary | ICD-10-CM

## 2012-06-13 DIAGNOSIS — Z85118 Personal history of other malignant neoplasm of bronchus and lung: Secondary | ICD-10-CM

## 2012-06-13 DIAGNOSIS — C4442 Squamous cell carcinoma of skin of scalp and neck: Secondary | ICD-10-CM

## 2012-06-13 DIAGNOSIS — J90 Pleural effusion, not elsewhere classified: Secondary | ICD-10-CM | POA: Insufficient documentation

## 2012-06-13 DIAGNOSIS — Z923 Personal history of irradiation: Secondary | ICD-10-CM | POA: Insufficient documentation

## 2012-06-13 DIAGNOSIS — L989 Disorder of the skin and subcutaneous tissue, unspecified: Secondary | ICD-10-CM

## 2012-06-13 DIAGNOSIS — I428 Other cardiomyopathies: Secondary | ICD-10-CM | POA: Insufficient documentation

## 2012-06-13 DIAGNOSIS — Z09 Encounter for follow-up examination after completed treatment for conditions other than malignant neoplasm: Secondary | ICD-10-CM | POA: Insufficient documentation

## 2012-06-13 DIAGNOSIS — N400 Enlarged prostate without lower urinary tract symptoms: Secondary | ICD-10-CM | POA: Insufficient documentation

## 2012-06-13 LAB — CBC
MCH: 28.8 pg (ref 26.0–34.0)
MCHC: 32.4 g/dL (ref 30.0–36.0)
MCV: 89 fL (ref 78.0–100.0)
Platelets: 211 10*3/uL (ref 150–400)

## 2012-06-13 LAB — COMPREHENSIVE METABOLIC PANEL
ALT: 10 U/L (ref 0–53)
AST: 15 U/L (ref 0–37)
Albumin: 3.1 g/dL — ABNORMAL LOW (ref 3.5–5.2)
Calcium: 9 mg/dL (ref 8.4–10.5)
GFR calc Af Amer: 75 mL/min — ABNORMAL LOW (ref >90)
Sodium: 139 mEq/L (ref 135–145)
Total Protein: 7.4 g/dL (ref 6.0–8.3)

## 2012-06-13 LAB — DIFFERENTIAL
Basophils Relative: 0 % (ref 0–1)
Eosinophils Absolute: 5.1 10*3/uL — ABNORMAL HIGH (ref 0.0–0.7)
Eosinophils Relative: 36 % — ABNORMAL HIGH (ref 0–5)
Monocytes Relative: 4 % (ref 3–12)
Neutrophils Relative %: 46 % (ref 43–77)

## 2012-06-13 NOTE — Progress Notes (Signed)
Problem #1 non-small cell lung cancer status post concomitant radiation and chemotherapy followed by post radiation chemotherapy finishing all therapy as of 04/25/2006 thus far he has had no recurrence. 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 Problem #3 BPH Problem #4 history of cardiomyopathy with PAF The patient is due for her CT scan of the chest which we will do today. He still has multiple skin lesions worrisome for recurrent/new cancers. He has 2 or 3 places on the chest anteriorly and a  significantl place on the back its approximate 6 x 4 cm left mid back suspicious for squamous cell carcinoma and a place on his scalp. In several other areas are just been watching on his face/ neck etc. He is still in good spirits. I do not feel any lymphadenopathy. Vital signs are recorded. He has clear lung fields. Heart shows fairly regular rhythm at this time without S3 gallop or murmur. Abdomen is soft and nontender without masses. He has no leg edema. Changes of his head and neck from surgery the past are still very obvious. We will see him in 12 months to get the results of his lab work today and a CAT scan

## 2012-06-13 NOTE — Patient Instructions (Addendum)
[facility]  Discharge Instructions  RECOMMENDATIONS MADE BY THE CONSULTANT AND ANY TEST RESULTS WILL BE SENT TO YOUR REFERRING DOCTOR.   EXAM FINDINGS BY MD TODAY AND SIGNS AND SYMPTOMS TO REPORT TO CLINIC OR PRIMARY MD: exam and discussion by MD.  Will do CT of your chest today.  MEDICATIONS PRESCRIBED: none   INSTRUCTIONS GIVEN AND DISCUSSED: Other :  Report increased shortness of breath, bone pain or other problems.  SPECIAL INSTRUCTIONS/FOLLOW-UP: Xray Studies Needed CT of Chest today and Return to Clinic in 1 year to see PA.   I acknowledge that I have been informed and understand all the instructions given to me and received a copy. I do not have any more questions at this time, but understand that I may call the Specialty Clinic at [facility] at [phone] during business hours should I have any further questions or need assistance in obtaining follow-up care.    __________________________________________  _____________  __________ Signature of Patient or Authorized Representative            Date                   Time    __________________________________________ Nurse's Signature

## 2012-09-29 ENCOUNTER — Emergency Department (HOSPITAL_COMMUNITY)
Admission: EM | Admit: 2012-09-29 | Discharge: 2012-09-29 | Disposition: A | Discharge status: Home / Self Care | Payer: Medicare Other | Attending: Emergency Medicine | Admitting: Emergency Medicine

## 2012-09-29 ENCOUNTER — Encounter (HOSPITAL_COMMUNITY): Payer: Self-pay | Admitting: *Deleted

## 2012-09-29 DIAGNOSIS — Z7901 Long term (current) use of anticoagulants: Secondary | ICD-10-CM | POA: Insufficient documentation

## 2012-09-29 DIAGNOSIS — Z8719 Personal history of other diseases of the digestive system: Secondary | ICD-10-CM | POA: Insufficient documentation

## 2012-09-29 DIAGNOSIS — Z8679 Personal history of other diseases of the circulatory system: Secondary | ICD-10-CM | POA: Insufficient documentation

## 2012-09-29 DIAGNOSIS — Z85118 Personal history of other malignant neoplasm of bronchus and lung: Secondary | ICD-10-CM | POA: Insufficient documentation

## 2012-09-29 DIAGNOSIS — C44300 Unspecified malignant neoplasm of skin of unspecified part of face: Secondary | ICD-10-CM | POA: Insufficient documentation

## 2012-09-29 DIAGNOSIS — Z87448 Personal history of other diseases of urinary system: Secondary | ICD-10-CM | POA: Insufficient documentation

## 2012-09-29 DIAGNOSIS — L989 Disorder of the skin and subcutaneous tissue, unspecified: Secondary | ICD-10-CM | POA: Insufficient documentation

## 2012-09-29 DIAGNOSIS — Z87828 Personal history of other (healed) physical injury and trauma: Secondary | ICD-10-CM | POA: Insufficient documentation

## 2012-09-29 DIAGNOSIS — I10 Essential (primary) hypertension: Secondary | ICD-10-CM | POA: Insufficient documentation

## 2012-09-29 MED ORDER — SILVER NITRATE-POT NITRATE 75-25 % EX MISC
CUTANEOUS | Status: AC
  Administered 2012-09-29: 21:00:00
  Filled 2012-09-29: qty 3

## 2012-09-29 NOTE — ED Notes (Signed)
Dr. Estell Harpin applied silver nitrate stick to skin lesion on patient's face. After applying, bleeding at lesion slowed down a good amount. This nurse then held constant pressure for 15 minutes. No bleeding at this time.

## 2012-09-29 NOTE — ED Notes (Signed)
Bleeding rt side of face. X 1  Hour, Has had skin cancer this area, and has been cauterized at md office  9 days ago.  Alert, talking.

## 2012-09-29 NOTE — ED Provider Notes (Signed)
History    This chart was scribed for Stephen Lennert, MD by Gerlean Ren, ED Scribe. This patient was seen in room APA09/APA09 and the patient's care was started at 8:39 PM    CSN: [id number]  Arrival date & time 09/29/12  2024   First MD Initiated Contact with Patient 09/29/12 2035      No chief complaint on file.    The history is provided by the patient. No language interpreter was used.  Stephen Martin is a 79 y.o. male with h/o HTN, ASCVD, lung carcinoma who presents to the Emergency Department complaining of one hour of constant bleeding from right side face anterior to right ear from scabbed areas associated with skin cancer.  Pt had area cauterized 9 days ago for similar bleeding.  Pt states he has not been excoriating the cauterized areas.  Pt takes daily Aspirin recommended by PCP Dr. Gerda Diss.   \Oncologist is Dr. Sande Brothers.    Past Medical History  Diagnosis Date  . Hypertension   . Arteriosclerotic cardiovascular disease (ASCVD)     Echocardiogram in 2008: Mild LVH with normal EF; cardiac cath in 09/2005-nonobstructive coronary disease with 50% LAD  . Nonsustained ventricular tachycardia   . Carotid bruit     Right; Carotid ultrasound in 08/2006: scattered atherosclerotic plaque without significant focal disease  . Paroxysmal atrial flutter     Apical left ventricular thrombus and 01/2011  . Chronic anticoagulation   . Abdominal wall hematoma     Admitted 01/2011; supratherapeutic INR  . Cardiomyopathy     EF of 35% in 01/2011  . Upper GI bleed   . BPH (benign prostatic hypertrophy) 06/14/2011  . Carcinoma, lung     Left upper lobe; stage III; treated with RT and chemotherapy  . Multiple Skin cancer 06/14/2011    Past Surgical History  Procedure Laterality Date  . Inguinal hernia repair    . Cholecystectomy    . Laparoscopic gastrotomy w/ repair of ulcer    . Skin lesion    . Skin biopsy      Family History  Problem Relation Age of Onset  . Stroke Mother   .  Alzheimer's disease Father     History  Substance Use Topics  . Smoking status: Never Smoker   . Smokeless tobacco: Never Used  . Alcohol Use: No      Review of Systems  Hematological:       Bleeding  All other systems reviewed and are negative.    Allergies  Review of patient's allergies indicates no known allergies.  Home Medications   Current Outpatient Rx  Name  Route  Sig  Dispense  Refill  . acetaminophen (TYLENOL) 325 MG tablet   Oral   Take 650 mg by mouth as needed.           Marland Kitchen oxybutynin (DITROPAN) 5 MG tablet   Oral   Take 1 tablet (5 mg total) by mouth daily.   30 tablet   3     BP 174/88  Pulse 102  Temp(Src) 98 F (36.7 C) (Oral)  Resp 18  Ht 6\' 2"  (1.88 m)  Wt 182 lb (82.555 kg)  BMI 23.36 kg/m2  SpO2 97%  Physical Exam  Nursing note and vitals reviewed. Constitutional: He is oriented to person, place, and time. He appears well-developed.  HENT:  Head: Normocephalic.  Eyes: Conjunctivae are normal.  Neck: No tracheal deviation present.  Cardiovascular: Normal rate.   Pulmonary/Chest: Effort normal.  No respiratory distress.  Musculoskeletal: Normal range of motion.  Neurological: He is oriented to person, place, and time.  Skin: Skin is warm.  Tiny bleeding ulcer to right cheek  Psychiatric: He has a normal mood and affect.    ED Course  Procedures (including critical care time) DIAGNOSTIC STUDIES: Oxygen Saturation is 97% on room air, adequate by my interpretation.    COORDINATION OF CARE: 8:46 PM- Patient informed of clinical course, understands medical decision-making process, and agrees with plan.  Labs Reviewed - No data to display No results found.   No diagnosis found.  Bleeding to the right cheek stoped with silver nitrate stick  MDM   The chart was scribed for me under my direct supervision.  I personally performed the history, physical, and medical decision making and all procedures in the evaluation of this  patient.Stephen Lennert, MD 09/29/12 2107

## 2012-11-17 ENCOUNTER — Ambulatory Visit (HOSPITAL_COMMUNITY)
Admission: RE | Admit: 2012-11-17 | Discharge: 2012-11-17 | Disposition: A | Discharge status: Home / Self Care | Payer: Medicare Other | Source: Ambulatory Visit | Attending: Family Medicine | Admitting: Family Medicine

## 2012-11-17 ENCOUNTER — Ambulatory Visit (INDEPENDENT_AMBULATORY_CARE_PROVIDER_SITE_OTHER): Payer: Medicare Other | Admitting: Family Medicine

## 2012-11-17 ENCOUNTER — Encounter: Payer: Self-pay | Admitting: Family Medicine

## 2012-11-17 VITALS — BP 136/72 | HR 70 | Wt 182.0 lb

## 2012-11-17 DIAGNOSIS — S60221A Contusion of right hand, initial encounter: Secondary | ICD-10-CM

## 2012-11-17 DIAGNOSIS — W19XXXA Unspecified fall, initial encounter: Secondary | ICD-10-CM | POA: Insufficient documentation

## 2012-11-17 DIAGNOSIS — I951 Orthostatic hypotension: Secondary | ICD-10-CM | POA: Insufficient documentation

## 2012-11-17 DIAGNOSIS — M25549 Pain in joints of unspecified hand: Secondary | ICD-10-CM | POA: Insufficient documentation

## 2012-11-17 DIAGNOSIS — M7989 Other specified soft tissue disorders: Secondary | ICD-10-CM | POA: Insufficient documentation

## 2012-11-17 DIAGNOSIS — S60229A Contusion of unspecified hand, initial encounter: Secondary | ICD-10-CM

## 2012-11-17 DIAGNOSIS — S6990XA Unspecified injury of unspecified wrist, hand and finger(s), initial encounter: Secondary | ICD-10-CM | POA: Insufficient documentation

## 2012-11-17 NOTE — Patient Instructions (Signed)
Do your xray.

## 2012-11-17 NOTE — Progress Notes (Signed)
  Subjective:    Patient ID: Stephen Martin, male    DOB: [birth year], 79 y.o.   MRN: [id number]  HPI Patient relates couple different times he's felt woozy and dizzy when he stands up for any significant length of time or if he didn't is over at the waist with his head below his waist. He denies sweats chills nausea vomiting he denies diarrhea. Denies fevers denies wheezing or difficulty breathing. He does state that he fell and injured his right hand cause some swelling and tenderness and pain and discomfort. He has had numerous skin surgeries and other health problems   Review of Systems    [phone]    Cell [phone]  Objective:   Physical Exam Lungs are clear hearts regular pulse normal he does have some mild orthostatic changes extremities no edema suffer a swollen right hand with some tenderness but he can make a fist to move his fingers       Assessment & Plan:  Hand contusion, right, initial encounter - Plan: DG Hand Complete Right  Orthostatic hypotension  He is to use a healthy amount of salt in his diet I think that will help him plus also x-ray was ordered await the results of this. He is to follow up here when necessary.

## 2012-11-22 ENCOUNTER — Emergency Department (HOSPITAL_COMMUNITY)
Admission: EM | Admit: 2012-11-22 | Discharge: 2012-11-22 | Disposition: A | Discharge status: Home / Self Care | Payer: Medicare Other | Attending: Emergency Medicine | Admitting: Emergency Medicine

## 2012-11-22 ENCOUNTER — Encounter (HOSPITAL_COMMUNITY): Payer: Self-pay | Admitting: Emergency Medicine

## 2012-11-22 DIAGNOSIS — Z85118 Personal history of other malignant neoplasm of bronchus and lung: Secondary | ICD-10-CM | POA: Insufficient documentation

## 2012-11-22 DIAGNOSIS — Z9889 Other specified postprocedural states: Secondary | ICD-10-CM | POA: Insufficient documentation

## 2012-11-22 DIAGNOSIS — Z8719 Personal history of other diseases of the digestive system: Secondary | ICD-10-CM | POA: Insufficient documentation

## 2012-11-22 DIAGNOSIS — T8131XA Disruption of external operation (surgical) wound, not elsewhere classified, initial encounter: Secondary | ICD-10-CM | POA: Insufficient documentation

## 2012-11-22 DIAGNOSIS — Z87448 Personal history of other diseases of urinary system: Secondary | ICD-10-CM | POA: Insufficient documentation

## 2012-11-22 DIAGNOSIS — I10 Essential (primary) hypertension: Secondary | ICD-10-CM | POA: Insufficient documentation

## 2012-11-22 DIAGNOSIS — Y838 Other surgical procedures as the cause of abnormal reaction of the patient, or of later complication, without mention of misadventure at the time of the procedure: Secondary | ICD-10-CM | POA: Insufficient documentation

## 2012-11-22 DIAGNOSIS — Z7982 Long term (current) use of aspirin: Secondary | ICD-10-CM | POA: Insufficient documentation

## 2012-11-22 DIAGNOSIS — Z8679 Personal history of other diseases of the circulatory system: Secondary | ICD-10-CM | POA: Insufficient documentation

## 2012-11-22 DIAGNOSIS — Z85828 Personal history of other malignant neoplasm of skin: Secondary | ICD-10-CM | POA: Insufficient documentation

## 2012-11-22 DIAGNOSIS — R58 Hemorrhage, not elsewhere classified: Secondary | ICD-10-CM

## 2012-11-22 MED ORDER — SILVER NITRATE-POT NITRATE 75-25 % EX MISC
CUTANEOUS | Status: AC
  Administered 2012-11-22: 4
  Filled 2012-11-22: qty 3

## 2012-11-22 MED ORDER — SILVER NITRATE-POT NITRATE 75-25 % EX MISC
CUTANEOUS | Status: AC
  Filled 2012-11-22: qty 1

## 2012-11-22 NOTE — ED Notes (Signed)
No bleeding at this time noted from dressing

## 2012-11-22 NOTE — ED Notes (Signed)
Pt states he has skin cancer removed yesterday and the site has been bleeding since last night. Pt denies taking any anticoagulants.

## 2012-11-22 NOTE — ED Provider Notes (Signed)
History    This chart was scribed for Benny Lennert, MD by Marlyne Beards, ED Scribe. The patient was seen in room APA04/APA04. Patient's care was started at 7:54 PM.    CSN: [id number]  Arrival date & time 11/22/12  1948   First MD Initiated Contact with Patient 11/22/12 1954      Chief Complaint  Patient presents with  . Coagulation Disorder    (Consider location/radiation/quality/duration/timing/severity/associated sxs/prior treatment) The history is provided by the patient. No language interpreter was used.   Stephen Martin is a 79 y.o. male with h/o HTN and ASCVD who presents to the Emergency Department complaining of a wound due to surgical removal of skin cancer yesterday. Pt's affected area has been bleeding since last night. Pt denies taking any anticoagulants and was told to refrain from taking his Aspirin prior to surgery. Bleeding is still occuring in the ED. Pt denies fever, chills, cough, nausea, vomiting, diarrhea, SOB, weakness, and any other associated symptoms. Pt's procedure was done at the skin cancer facility in [place], [place]. Pt's current PCP is Dr. Gerda Diss.   Past Medical History  Diagnosis Date  . Hypertension   . Arteriosclerotic cardiovascular disease (ASCVD)     Echocardiogram in 2008: Mild LVH with normal EF; cardiac cath in 09/2005-nonobstructive coronary disease with 50% LAD  . Nonsustained ventricular tachycardia   . Carotid bruit     Right; Carotid ultrasound in 08/2006: scattered atherosclerotic plaque without significant focal disease  . Paroxysmal atrial flutter     Apical left ventricular thrombus and 01/2011  . Chronic anticoagulation   . Abdominal wall hematoma     Admitted 01/2011; supratherapeutic INR  . Cardiomyopathy     EF of 35% in 01/2011  . Upper GI bleed   . BPH (benign prostatic hypertrophy) 06/14/2011  . Carcinoma, lung     Left upper lobe; stage III; treated with RT and chemotherapy  . Multiple Skin cancer 06/14/2011    Past  Surgical History  Procedure Laterality Date  . Inguinal hernia repair    . Cholecystectomy    . Laparoscopic gastrotomy w/ repair of ulcer    . Skin lesion    . Skin biopsy      Family History  Problem Relation Age of Onset  . Stroke Mother   . Alzheimer's disease Father     History  Substance Use Topics  . Smoking status: Never Smoker   . Smokeless tobacco: Never Used  . Alcohol Use: No      Review of Systems  Constitutional: Negative for appetite change and fatigue.  HENT: Negative for congestion, sinus pressure and ear discharge.   Eyes: Negative for discharge.  Respiratory: Negative for cough.   Gastrointestinal: Negative for diarrhea.  Genitourinary: Negative for frequency and hematuria.  Musculoskeletal: Negative for back pain.  Skin: Positive for wound. Negative for rash.  Neurological: Negative for seizures.  Psychiatric/Behavioral: Negative for hallucinations.    Allergies  Review of patient's allergies indicates no known allergies.  Home Medications   Current Outpatient Rx  Name  Route  Sig  Dispense  Refill  . aspirin EC 81 MG tablet   Oral   Take 81 mg by mouth every morning.         Marland Kitchen oxybutynin (DITROPAN) 5 MG tablet      daily.            BP 158/88  Pulse 106  Temp(Src) 97.5 F (36.4 C) (Oral)  Resp 20  Ht  6\' 2"  (1.88 m)  Wt 182 lb (82.555 kg)  BMI 23.36 kg/m2  SpO2 99%  Physical Exam  Nursing note and vitals reviewed. Constitutional: He is oriented to person, place, and time. He appears well-developed.  HENT:  Head: Normocephalic.  Eyes: Conjunctivae are normal.  Neck: No tracheal deviation present.  Cardiovascular:  No murmur heard. Musculoskeletal: Normal range of motion.  Neurological: He is oriented to person, place, and time.  Skin: Skin is warm.  Bleeding from left side of forehead from a skin cancer removal yesterday.  Psychiatric: He has a normal mood and affect.    ED Course  Procedures (including critical  care time) DIAGNOSTIC STUDIES: Oxygen Saturation is 99% on room air, normal by my interpretation.    COORDINATION OF CARE: 8:06 PM Discussed ED treatment with pt and pt agrees. Silver nitrate sticks and Quick clot was applied on pt's forehead to control bleeding. 8:11 PM Bleeding is controled.  8:33 PM Consulted with pt to schedule appointment with skin doctor tomorrow.  Labs Reviewed - No data to display No results found.   No diagnosis found.   Bleeding stop with quickclot MDM   The chart was scribed for me under my direct supervision.  I personally performed the history, physical, and medical decision making and all procedures in the evaluation of this patient.Benny Lennert, MD 11/22/12 2038

## 2012-11-22 NOTE — ED Notes (Signed)
Pt alert & oriented x4, stable gait. Patient given discharge instructions, paperwork & prescription(s). Patient  instructed to stop at the registration desk to finish any additional paperwork. Patient verbalized understanding. Pt left department w/ no further questions. 

## 2012-11-22 NOTE — ED Notes (Signed)
Pt states same thing happened this time as it did last time, had skin cancer removed & wont stop bleeding. States cancer doctor had to put something in it to get it to stop last time. His dr told him to come up to [facility].

## 2012-12-28 NOTE — Progress Notes (Signed)
Lilyan Punt, MD [address] [place] [address]  Non-small cell lung cancer, left - Plan: CT Chest Wo Contrast, CBC with Differential, Comprehensive metabolic panel  CURRENT THERAPY: Surveillance per NCCN guidelines with annual CT surveillance, due in November.  INTERVAL HISTORY: Stephen Martin 79 y.o. male returns for  regular  visit for followup of Non-small cell lung cancer status post concomitant radiation and chemotherapy followed by post radiation chemotherapy finishing all therapy as of 04/25/2006 thus far he has had no recurrence.   He reports that he had a biopsy of a chest lesion by a dermatologist that was positive for melanoma.  The patient was asked for permission to have the pathology sent to [facility] and he obliged.  Since the diagnosis was positive for melanoma, his local dermatologist has referred the patient to [facility].  The story is disjointed and therefore we will request records from Dr. Park Liter, dermatologist.  He has an appointment at [facility] on Tuesday and I have recommended he continue with this consultation at this time.  I have requested that the patient ask for their notes to be sent to [place].    He denies any other oncologic complaints and ROS questioning is negative.   From a lung cancer standpoint, he is stable without any recurrence.  We will continue to complete surveillance per NCCN guidelines with annual CT scan of chest for surveillance.   Past Medical History  Diagnosis Date  . Hypertension   . Arteriosclerotic cardiovascular disease (ASCVD)     Echocardiogram in 2008: Mild LVH with normal EF; cardiac cath in 09/2005-nonobstructive coronary disease with 50% LAD  . Nonsustained ventricular tachycardia   . Carotid bruit     Right; Carotid ultrasound in 08/2006: scattered atherosclerotic plaque without significant focal disease  . Paroxysmal atrial flutter     Apical left ventricular thrombus and 01/2011  . Chronic  anticoagulation   . Abdominal wall hematoma     Admitted 01/2011; supratherapeutic INR  . Cardiomyopathy     EF of 35% in 01/2011  . Upper GI bleed   . BPH (benign prostatic hypertrophy) 06/14/2011  . Carcinoma, lung     Left upper lobe; stage III; treated with RT and chemotherapy  . Multiple Skin cancer 06/14/2011    has HYPERTENSION; Arteriosclerotic cardiovascular disease (ASCVD); Chronic anticoagulation; Atrial fibrillation; Non-small cell lung cancer; Nonsustained ventricular tachycardia; Carotid bruit; Cardiomyopathy; Abdominal wall hematoma; Multiple Skin cancer; BPH (benign prostatic hypertrophy); and Orthostatic hypotension on his problem list.     has No Known Allergies.  Mr. Palinkas does not currently have medications on file.  Past Surgical History  Procedure Laterality Date  . Inguinal hernia repair    . Cholecystectomy    . Laparoscopic gastrotomy w/ repair of ulcer    . Skin lesion    . Skin biopsy      Denies any headaches, dizziness, double vision, fevers, chills, night sweats, nausea, vomiting, diarrhea, constipation, chest pain, heart palpitations, shortness of breath, blood in stool, black tarry stool, urinary pain, urinary burning, urinary frequency, hematuria.   PHYSICAL EXAMINATION  ECOG PERFORMANCE STATUS: 1 - Symptomatic but completely ambulatory  Filed Vitals:   12/29/12 1500  BP: 150/84  Pulse: 106  Temp: 97.6 F (36.4 C)  Resp: 16    GENERAL:alert, no distress, well nourished, well developed, comfortable, cooperative and smiling SKIN: multiple scars from surgical resection of squamous cell carcinomas of skin. HEAD: Normocephalic, multiple scars from surgical resection of squamous  cell carcinomas of skin with left neck lymph node dissection in past and deformity secondary to the aforementioned.  EYES: normal, EOMI, Conjunctiva are pink and non-injected EARS: External ears normal OROPHARYNX:edentulous and mucous membranes are moist  NECK: supple, no  adenopathy, trachea midline LYMPH:  no palpable lymphadenopathy, no hepatosplenomegaly BREAST:not examined LUNGS: clear to auscultation and percussion HEART: regular rate & rhythm, no murmurs, no gallops, S1 normal and S2 normal ABDOMEN:abdomen soft, non-tender, normal bowel sounds, no masses or organomegaly and no hepatosplenomegaly BACK: Back symmetric, no curvature., No CVA tenderness EXTREMITIES:less then 2 second capillary refill, no joint deformities, effusion, or inflammation, no edema, no skin discoloration, no clubbing, no cyanosis  NEURO: alert & oriented x 3 with fluent speech, no focal motor/sensory deficits, gait normal   LABORATORY DATA: CBC    Component Value Date/Time   WBC 14.1* 06/13/2012 1212   RBC 4.89 06/13/2012 1212   HGB 14.1 06/13/2012 1212   HCT 43.5 06/13/2012 1212   PLT 211 06/13/2012 1212   MCV 89.0 06/13/2012 1212   MCH 28.8 06/13/2012 1212   MCHC 32.4 06/13/2012 1212   RDW 13.4 06/13/2012 1212   LYMPHSABS 1.9 06/13/2012 1212   MONOABS 0.6 06/13/2012 1212   EOSABS 5.1* 06/13/2012 1212   BASOSABS 0.0 06/13/2012 1212      Chemistry      Component Value Date/Time   NA 139 06/13/2012 1212   K 3.9 06/13/2012 1212   CL 105 06/13/2012 1212   CO2 26 06/13/2012 1212   BUN 18 06/13/2012 1212   CREATININE 1.06 06/13/2012 1212      Component Value Date/Time   CALCIUM 9.0 06/13/2012 1212   ALKPHOS 87 06/13/2012 1212   AST 15 06/13/2012 1212   ALT 10 06/13/2012 1212   BILITOT 0.4 06/13/2012 1212        RADIOGRAPHIC STUDIES:  06/13/2012  *RADIOLOGY REPORT*  Clinical Data: History of non-small cell lung cancer  CT CHEST WITHOUT CONTRAST  Technique: Multidetector CT imaging of the chest was performed  following the standard protocol without IV contrast.  Comparison: Chest CT - 06/11/2011; 01/13/2011; 12/04/2009  Findings:  Sequela of prior radiation change most conspicuously involving the  medial aspect of the left lung and hilum with associated  atelectasis and  bronchiectasis within the left upper and lower  lobes. Grossly unchanged similar but less extensive geographic  fibrosis about the right hilum with associated bronchiectasis  involving the right upper lobe.  Grossly unchanged small left-sided pleural effusion. Grossly  unchanged right basilar pleural parenchymal thickening. No new  focal airspace opacities. No discrete pulmonary nodules. Grossly  unchanged subpleural reticulation within the right lower lobe.  Central airways are unchanged.  Small pericardial effusion is unchanged. Normal heart size.  Coronary artery calcifications. Atherosclerotic calcifications of  a normal caliber thoracic aorta. No definite mediastinal  adenopathy. Evaluation for hilar adenopathy is limited given  radiation change and lack of intravenous contrast. No hilar  adenopathy.  Normal noncontrast appearance of the thyroid. Postsurgical changes  of the imaged superior aspect of the left neck. Calcifications  within the bilateral carotid bulbs.  Incidental noncontrast imaging of the upper abdomen demonstrates a  sequela of prior cholecystectomy but is otherwise unremarkable.  No acute or aggressive osseous abnormalities. Thoracic spine  degenerative change  IMPRESSION:  1. No definitive evidence of residual or recurrent disease within  the chest. Further evaluation with PET scan may be performed as  clinically indicated.  2. Grossly stable radiation change of the  bilateral lungs, left  greater than right.  3. Grossly unchanged small pericardial and left-sided pleural  effusions.  4. Coronary artery calcifications.  Original Report Authenticated By: Tacey Ruiz, MD     ASSESSMENT: 1. Non-small cell lung cancer status post concomitant radiation and chemotherapy followed by post radiation chemotherapy finishing all therapy as of 04/25/2006 thus far he has had no recurrence.  2. 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 BPH  4. History of cardiomyopathy with PAF 5. Newly diagnosed Melanoma of chest lesion  Patient Active Problem List   Diagnosis Date Noted  . Orthostatic hypotension 11/17/2012  . Multiple Skin cancer 06/14/2011  . BPH (benign prostatic hypertrophy) 06/14/2011  . Non-small cell lung cancer   . Nonsustained ventricular tachycardia   . Carotid bruit   . Cardiomyopathy   . Abdominal wall hematoma   . Chronic anticoagulation 01/18/2011  . Atrial fibrillation 01/18/2011  . HYPERTENSION 04/09/2009  . Arteriosclerotic cardiovascular disease (ASCVD) 04/09/2009      PLAN:  1. I personally reviewed and went over radiographic studies with the patient. 2. I personally reviewed and went over laboratory results with the patient. 3. Next Surveillance CT of chest without contrast due in November for his annual exam per NCCN guidelines 4. Will ascertain notes from Dr. Park Liter, dermatologist 5. Patient encouraged to follow-up as scheduled with [facility] for this newly diagnosed Melanoma. 6. I do not have any notes or pathology information regarding his newly diagnosed melanoma.  7. Labs in 6 months: CBC diff, CMET 8. Return in 6 months for follow-up following labs and CT scan. .  All questions were answered. The patient knows to call the clinic with any problems, questions or concerns. We can certainly see the patient much sooner if necessary.  Patient and plan discussed with Dr. Erline Hau and he is in agreement with the aforementioned.   KEFALAS,THOMAS

## 2012-12-29 ENCOUNTER — Encounter (HOSPITAL_COMMUNITY): Payer: Medicare Other | Attending: Oncology | Admitting: Oncology

## 2012-12-29 VITALS — BP 150/84 | HR 106 | Temp 97.6°F | Resp 16 | Wt 181.3 lb

## 2012-12-29 DIAGNOSIS — C4359 Malignant melanoma of other part of trunk: Secondary | ICD-10-CM

## 2012-12-29 DIAGNOSIS — Z85118 Personal history of other malignant neoplasm of bronchus and lung: Secondary | ICD-10-CM

## 2012-12-29 DIAGNOSIS — C44320 Squamous cell carcinoma of skin of unspecified parts of face: Secondary | ICD-10-CM

## 2012-12-29 DIAGNOSIS — C3492 Malignant neoplasm of unspecified part of left bronchus or lung: Secondary | ICD-10-CM

## 2012-12-29 DIAGNOSIS — C44221 Squamous cell carcinoma of skin of unspecified ear and external auricular canal: Secondary | ICD-10-CM

## 2012-12-29 NOTE — Patient Instructions (Addendum)
[facility] Discharge Instructions  RECOMMENDATIONS MADE BY THE CONSULTANT AND ANY TEST RESULTS WILL BE SENT TO YOUR REFERRING PHYSICIAN.  EXAM FINDINGS BY THE PHYSICIAN TODAY AND SIGNS OR SYMPTOMS TO REPORT TO CLINIC OR PRIMARY PHYSICIAN: Exam and discussion by PA.  We need to get the pathology from your dermatologist.  Blood work is stable.   MEDICATIONS PRESCRIBED:  none  INSTRUCTIONS GIVEN AND DISCUSSED: Report unusual shortness of breath, blood in sputum or other problems.  SPECIAL INSTRUCTIONS/FOLLOW-UP: CT of your chest in 6 months then to be seen by MD.  Thank you for choosing [facility] to provide your oncology and hematology care.  To afford each patient quality time with our providers, please arrive at least 15 minutes before your scheduled appointment time.  With your help, our goal is to use those 15 minutes to complete the necessary work-up to ensure our physicians have the information they need to help with your evaluation and healthcare recommendations.    Effective January 1st, 2014, we ask that you re-schedule your appointment with our physicians should you arrive 10 or more minutes late for your appointment.  We strive to give you quality time with our providers, and arriving late affects you and other patients whose appointments are after yours.    Again, thank you for choosing [facility].  Our hope is that these requests will decrease the amount of time that you wait before being seen by our physicians.       _____________________________________________________________  Should you have questions after your visit to [facility], please contact our office at [phone] between the hours of 8:30 a.m. and 5:00 p.m.  Voicemails left after 4:30 p.m. will not be returned until the following business day.  For prescription refill requests, have your pharmacy contact our office with your prescription refill  request.

## 2013-01-06 ENCOUNTER — Other Ambulatory Visit: Payer: Self-pay | Admitting: Family Medicine

## 2013-01-23 ENCOUNTER — Other Ambulatory Visit: Payer: Self-pay | Admitting: Family Medicine

## 2013-01-31 ENCOUNTER — Other Ambulatory Visit: Payer: Self-pay | Admitting: Family Medicine

## 2013-01-31 ENCOUNTER — Telehealth: Payer: Self-pay | Admitting: Family Medicine

## 2013-01-31 DIAGNOSIS — C4490 Unspecified malignant neoplasm of skin, unspecified: Secondary | ICD-10-CM

## 2013-01-31 NOTE — Telephone Encounter (Signed)
Spoke with daughter in Social worker. Pt has a daughter coming in today to check tegaderm.

## 2013-01-31 NOTE — Telephone Encounter (Signed)
Patient just had surgery yesterday down his chest and pts wife is stating that it needs to be drained and she cannot do it. She would like you to call her back ASAP to tell her what she needs to do about getting a nurse there today to help.

## 2013-02-16 DIAGNOSIS — C44519 Basal cell carcinoma of skin of other part of trunk: Secondary | ICD-10-CM

## 2013-03-07 ENCOUNTER — Other Ambulatory Visit: Payer: Self-pay | Admitting: *Deleted

## 2013-03-07 MED ORDER — OXYBUTYNIN CHLORIDE 5 MG PO TABS: 5.0000 mg | ORAL_TABLET | Freq: Every day | ORAL | Status: DC

## 2013-05-01 HISTORY — PX: MOHS SURGERY: SUR867

## 2013-05-01 HISTORY — PX: OTHER SURGICAL HISTORY: SHX169

## 2013-05-10 ENCOUNTER — Ambulatory Visit: Payer: Medicare Other

## 2013-05-10 ENCOUNTER — Ambulatory Visit: Payer: Medicare Other | Admitting: Radiation Oncology

## 2013-05-16 ENCOUNTER — Ambulatory Visit: Payer: Medicare Other | Attending: Radiation Oncology | Admitting: Radiation Oncology

## 2013-05-16 ENCOUNTER — Ambulatory Visit: Payer: Medicare Other

## 2013-05-16 ENCOUNTER — Encounter: Payer: Self-pay | Admitting: Radiation Oncology

## 2013-05-16 ENCOUNTER — Ambulatory Visit: Admission: RE | Admit: 2013-05-16 | Payer: Medicare Other | Source: Ambulatory Visit

## 2013-05-16 ENCOUNTER — Telehealth: Payer: Self-pay | Admitting: Radiation Oncology

## 2013-05-16 ENCOUNTER — Ambulatory Visit: Payer: Medicare Other | Admitting: Radiation Oncology

## 2013-05-16 NOTE — Progress Notes (Signed)
79 year old male. Referred by Dr. 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   S/P Mohs surgery done 04/30/2013 for melanoma of the chest. Also, during the same surgery Dr. Park Liter removed the patient's left ear for another aggressive SCC.  HX of non small cell lung cancer s/p concomitant radiation and chemotherapy followed by post radiation chemotherapy finsihing all therapy as of 04/25/06 thus far he has had no recurrence.   Patient has multiple scar from surgical resection of squamous cell carcinoma of skin with left neck lymph node dissection in past and deformity secondary to the aforementioned.

## 2013-05-16 NOTE — Telephone Encounter (Signed)
Patient has not shown for appointment. Phoned cell and home to assess status. No answer. Left message requesting return call.

## 2013-05-18 ENCOUNTER — Telehealth: Payer: Self-pay | Admitting: *Deleted

## 2013-05-18 NOTE — Telephone Encounter (Signed)
LVM for patient to call to reschedule missed appointment 05/16/13

## 2013-05-23 ENCOUNTER — Encounter: Payer: Self-pay | Admitting: *Deleted

## 2013-05-29 ENCOUNTER — Ambulatory Visit (INDEPENDENT_AMBULATORY_CARE_PROVIDER_SITE_OTHER): Payer: Medicare Other | Admitting: Family Medicine

## 2013-05-29 ENCOUNTER — Encounter: Payer: Self-pay | Admitting: Family Medicine

## 2013-05-29 ENCOUNTER — Telehealth: Payer: Self-pay | Admitting: *Deleted

## 2013-05-29 VITALS — BP 122/82 | Ht 73.0 in | Wt 176.0 lb

## 2013-05-29 DIAGNOSIS — R609 Edema, unspecified: Secondary | ICD-10-CM

## 2013-05-29 DIAGNOSIS — R0609 Other forms of dyspnea: Secondary | ICD-10-CM

## 2013-05-29 DIAGNOSIS — R5381 Other malaise: Secondary | ICD-10-CM

## 2013-05-29 DIAGNOSIS — Z23 Encounter for immunization: Secondary | ICD-10-CM

## 2013-05-29 MED ORDER — POTASSIUM CHLORIDE ER 10 MEQ PO TBCR: 10.0000 meq | EXTENDED_RELEASE_TABLET | Freq: Two times a day (BID) | ORAL | Status: DC

## 2013-05-29 MED ORDER — FUROSEMIDE 20 MG PO TABS: 20.0000 mg | ORAL_TABLET | Freq: Every day | ORAL | Status: DC

## 2013-05-29 MED ORDER — OXYBUTYNIN CHLORIDE 5 MG PO TABS: 5.0000 mg | ORAL_TABLET | Freq: Every day | ORAL | Status: DC

## 2013-05-29 NOTE — Telephone Encounter (Signed)
Patient called today to reschedule appointment.  I returned his call, but no answer.  Asked patient to call me back

## 2013-05-29 NOTE — Progress Notes (Signed)
  Subjective:    Patient ID: Stephen Martin, male    DOB: January 07, 1931, 79 y.o.   MRN: [id number]  HPIBilateral foot swelling. Started last week.   Sore on bottom of right foot. Been there for awhile. Hard to walk on. Patient states that he's had a sore area on the right foot and present for several weeks. Hurts to walk on it. He does best he can. He denies any injury to it. Denies any fever sweats chills he does get somewhat short of breath when he pushes up a hill. He denies any abdominal pain vomiting diarrhea bloody stools. PMH benign cardiomyopathy hypertension atrial fibrillation family history noncontributory social doesn't smoke recently been treated for skin cancers    Review of Systems see above denies PND denies chest pressure     Objective:   Physical Exam Neck no masses lungs are clear no crackles heart is regular abdomen soft extremities 1-2+ edema neurologic grossly normal  Also significant time spent with patient parring down callus on the right foot       Assessment & Plan:  Callus right foot-possible plantar wart hopefully this will do better now that we've removed part of the callus #2 pedal edema I doubt that this is do to CHF but we will go ahead with some testing as well as diuretic. Patient was encouraged to followup with [place] in a few weeks for recheck warning signs was discussed with him.

## 2013-06-06 ENCOUNTER — Encounter: Payer: Self-pay | Admitting: Radiation Oncology

## 2013-06-06 DIAGNOSIS — C3490 Malignant neoplasm of unspecified part of unspecified bronchus or lung: Secondary | ICD-10-CM | POA: Insufficient documentation

## 2013-06-06 DIAGNOSIS — Z923 Personal history of irradiation: Secondary | ICD-10-CM | POA: Insufficient documentation

## 2013-06-06 NOTE — Progress Notes (Signed)
Histology and Location of Primary Skin Cancer: right ear, preauricular   Patient presented with the following signs/symptoms,  Skin cancer 1 month ago  Past/Anticipated interventions by patient's surgeon/dermatologist for current problematic lesion, if any: Moh's surgery 05/15/13  Past skin cancers, if any: inumerable  1) Location/Histology/Intervention: chest  2) Location/Histology/Intervention: left face/ear  3) Location/Histology/Intervention: back, arms  History of Blistering sunburns, if any: worked outside x many years  SAFETY ISSUES:  Prior radiation? YES, 2005 left parotid bed, upper jugular chain. 2007 central mediastinum, left lower lung  Pacemaker/ICD? no  Possible current pregnancy? na  Is the patient on methotrexate? no  Current Complaints / other details:  Married, 5 children, retired Electrical engineer. Pt states he lives w/wife who has been in hospital x 5 weeks for hip surgery and rehab. Pt states he has appt w/Dr Park Liter on 06/12/13 to have further procedures on left ear area, chin, back and right ear. 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

## 2013-06-07 ENCOUNTER — Other Ambulatory Visit: Payer: Self-pay | Admitting: Radiation Oncology

## 2013-06-07 ENCOUNTER — Ambulatory Visit
Admission: RE | Admit: 2013-06-07 | Discharge: 2013-06-07 | Disposition: A | Discharge status: Home / Self Care | Payer: Medicare Other | Source: Ambulatory Visit | Attending: Radiation Oncology | Admitting: Radiation Oncology

## 2013-06-07 ENCOUNTER — Encounter: Payer: Self-pay | Admitting: Radiation Oncology

## 2013-06-07 VITALS — BP 106/73 | HR 120 | Temp 97.6°F | Resp 20 | Wt 178.5 lb

## 2013-06-07 DIAGNOSIS — I10 Essential (primary) hypertension: Secondary | ICD-10-CM | POA: Insufficient documentation

## 2013-06-07 DIAGNOSIS — I428 Other cardiomyopathies: Secondary | ICD-10-CM | POA: Insufficient documentation

## 2013-06-07 DIAGNOSIS — C4490 Unspecified malignant neoplasm of skin, unspecified: Secondary | ICD-10-CM

## 2013-06-07 DIAGNOSIS — Z85118 Personal history of other malignant neoplasm of bronchus and lung: Secondary | ICD-10-CM | POA: Insufficient documentation

## 2013-06-07 DIAGNOSIS — I4892 Unspecified atrial flutter: Secondary | ICD-10-CM | POA: Insufficient documentation

## 2013-06-07 DIAGNOSIS — Z923 Personal history of irradiation: Secondary | ICD-10-CM | POA: Insufficient documentation

## 2013-06-07 DIAGNOSIS — Z87891 Personal history of nicotine dependence: Secondary | ICD-10-CM | POA: Insufficient documentation

## 2013-06-07 DIAGNOSIS — Z85820 Personal history of malignant melanoma of skin: Secondary | ICD-10-CM | POA: Insufficient documentation

## 2013-06-07 DIAGNOSIS — Z79899 Other long term (current) drug therapy: Secondary | ICD-10-CM | POA: Insufficient documentation

## 2013-06-07 DIAGNOSIS — IMO0002 Reserved for concepts with insufficient information to code with codable children: Secondary | ICD-10-CM

## 2013-06-07 DIAGNOSIS — Z9221 Personal history of antineoplastic chemotherapy: Secondary | ICD-10-CM | POA: Insufficient documentation

## 2013-06-07 DIAGNOSIS — C44320 Squamous cell carcinoma of skin of unspecified parts of face: Secondary | ICD-10-CM

## 2013-06-07 DIAGNOSIS — C4492 Squamous cell carcinoma of skin, unspecified: Secondary | ICD-10-CM | POA: Insufficient documentation

## 2013-06-07 DIAGNOSIS — I2510 Atherosclerotic heart disease of native coronary artery without angina pectoris: Secondary | ICD-10-CM | POA: Insufficient documentation

## 2013-06-07 DIAGNOSIS — N400 Enlarged prostate without lower urinary tract symptoms: Secondary | ICD-10-CM | POA: Insufficient documentation

## 2013-06-07 HISTORY — DX: Personal history of irradiation: Z92.3

## 2013-06-07 HISTORY — DX: Reserved for concepts with insufficient information to code with codable children: IMO0002

## 2013-06-07 HISTORY — DX: Peptic ulcer, site unspecified, unspecified as acute or chronic, without hemorrhage or perforation: K27.9

## 2013-06-07 HISTORY — DX: Malignant melanoma of skin, unspecified: C43.9

## 2013-06-07 LAB — HEPATIC FUNCTION PANEL
Bilirubin, Direct: 0.1 mg/dL (ref 0.0–0.3)
Indirect Bilirubin: 0.4 mg/dL (ref 0.0–0.9)
Total Protein: 6.4 g/dL (ref 6.0–8.3)

## 2013-06-07 LAB — BASIC METABOLIC PANEL
BUN: 14 mg/dL (ref 6–23)
Chloride: 101 mEq/L (ref 96–112)
Glucose, Bld: 88 mg/dL (ref 70–99)
Potassium: 4.2 mEq/L (ref 3.5–5.3)
Sodium: 136 mEq/L (ref 135–145)

## 2013-06-07 NOTE — Progress Notes (Signed)
Please see the Nurse Progress Note in the MD Initial Consult Encounter for this patient. 

## 2013-06-07 NOTE — Progress Notes (Signed)
[facility] Radiation Oncology NEW PATIENT EVALUATION  Name: Stephen Martin MRN: [id number]  Date:   06/07/2013           DOB: 1931/04/03  Status: outpatient   CC: Stephen Punt, MD  Stephen Mering, MD    REFERRING PHYSICIAN: Herma Mering, MD   DIAGNOSIS: Squamous cell carcinoma the skin, right preauricular region   HISTORY OF PRESENT ILLNESS:  Stephen Martin is a 79 y.o. male who is seen today for the courtesy of Dr. Herma Martin for consideration of postoperative ration therapy to his right preauricular region/parotid bed, and neck in the management of his squamous cell carcinoma the skin arising from the right periaricular region. 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 Details of his radiation therapy are pending at the time this dictation, and all of his treatment was given at the [facility] in [facility] under the direction of Stephen Martin. He has an extensive history of multiple skin cancers arising from the face, trunk, and extremities, treated by surgical resection. Of note is that he did have a malignant melanoma excised from his left inferior chest with a Breslow depth of 1.93 mm (Clark's Level IV) this past spring. More recently, he had Mohs surgeries on 05/01/2013 for squamous cell carcinoma along the superior helix of the left ear requiring 2 stages, and 1.6 x 1.2 cm right preauricular squamous cell carcinoma. After 2 stages of his right preauricular Mohs surgery, there was residual multifocal squamous cell carcinoma involving the deep margin and also perineural invasion. Stephen Martin decided to abort further surgery and rely on adjuvant radiation therapy. He had a simple wound  closure of a 4.5 x 3.0 cm defect. He is without complaints today. Stephen Martin is still working on his left ear tumor bed because of delayed healing of his porcine graft presumably is secondary to previous radiation therapy.  PREVIOUS RADIATION THERAPY: Status post.of Aredia therapy to his left parotid bed and neck delivering 4500 cGy followed by electron beam boost to 6300 cGy with completion of radiation therapy on 04/22/2004. He was treated with a wedge pair technique and electron beam boost. Fields and dosimetry are pending at the time this dictation. He is also status post radiation therapy to his left lower lung squamous cell carcinoma which was completed in March of 2007. Again, details are pending at the time this dictation.   PAST MEDICAL HISTORY:  has a past medical history of Hypertension; Arteriosclerotic cardiovascular disease (ASCVD); Nonsustained ventricular tachycardia; Carotid bruit; Paroxysmal atrial flutter; Chronic anticoagulation; Abdominal wall hematoma; Cardiomyopathy; Upper GI bleed; BPH (benign prostatic hypertrophy) (06/14/2011); radiation therapy (03/04/04- 04/22/04); Squamous cell carcinoma (2005); History of radiation therapy (09/16/05- 11/05/05); Carcinoma, lung (2007); Multiple Skin cancer (06/14/2011); PUD (peptic ulcer disease); Melanoma; and Squamous cell carcinoma (2014).     PAST SURGICAL HISTORY:  Past Surgical History  Procedure Laterality Date  . Inguinal hernia repair    . Cholecystectomy    . Laparoscopic gastrotomy w/ repair of ulcer    . Skin lesion    . Skin biopsy    . Mohs surgery  05/01/2013    melanoma of chest  . 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  Skin graft Left 11/2003    left face  . Neck dissection Left     resection of squamous cell carcinoma-skin     FAMILY HISTORY: family history includes Alzheimer's disease in his father; Cancer in his daughter; Hypertension in his mother; Skin cancer in his sister; Stroke in his mother. His  father died at age 71 secondary to complications of Alzheimer's disease. His mother died at age 79 from complications of hypertension.   SOCIAL HISTORY:  reports that he has quit smoking. He quit smokeless tobacco use about 60 years ago. He reports that he does not drink alcohol or use illicit drugs. Married with 5 children. He worked in a [employer] most of his life. He had a significant amount of sun exposure.   ALLERGIES: Review of patient's allergies indicates no known allergies.   MEDICATIONS:  Current Outpatient Prescriptions  Medication Sig Dispense Refill  . oxybutynin (DITROPAN) 5 MG tablet Take 1 tablet (5 mg total) by mouth daily.  30 tablet  6  . furosemide (LASIX) 20 MG tablet Take 1 tablet (20 mg total) by mouth daily.  30 tablet  11  . potassium chloride (K-DUR) 10 MEQ tablet Take 1 tablet (10 mEq total) by mouth 2 (two) times daily.  60 tablet  6   No current facility-administered medications for this encounter.     REVIEW OF SYSTEMS:  Pertinent items are noted in HPI.  Performance status: ECOG = 0    PHYSICAL EXAM:  weight is 178 lb 8 oz (80.967 kg). His oral temperature is 97.6 F (36.4 C). His blood pressure is 106/73 and his pulse is 120. His respiration is 20.   Alert and oriented and somewhat hard of hearing 79 year old white male with numerous scars and skin changes from previous skin cancer treatments/surgeries. He has markedly diminished hearing on the left. The left external ear has been essentially amputated, and there is a darkened graft of questionable viability. On inspection the right preauricular region there is a residual scab measuring 0.9 cm just anterior to the region of the tragus. There is no palpable right periaricular, submandibular ,or cervical lymphadenopathy. There is a 0.4 cm scab just posterior to the ear, anterior to his hairline. On inspection of the oral cavity he is edentulous. (He tells me he had dental extractions prior to his  postoperative radiation therapy back in 2005). Cranial nerves II through XII grossly intact except for diminished hearing on the left. There is no obvious facial weakness.   LABORATORY DATA:  Lab Results  Component Value Date   WBC 14.1* 06/13/2012   HGB 14.1 06/13/2012   HCT 43.5 06/13/2012   MCV 89.0 06/13/2012   PLT 211 06/13/2012   Lab Results  Component Value Date   NA 136 06/06/2013   K 4.2 06/06/2013   CL 101 06/06/2013   CO2 28 06/06/2013   Lab Results  Component Value Date   ALT 9 06/06/2013   AST 15 06/06/2013   ALKPHOS 90 06/06/2013   BILITOT 0.5 06/06/2013   BUN 14, creatinine 0.93 from 06/06/2013   IMPRESSION: Locally advanced squamous cell carcinoma arising from the right periaricular region. He has positive deep margins and perineural invasion for which I recommend postoperative radiation therapy. I would like to obtain a MRI scan for staging of his cancer to assess whether there is  deep nerve/skull base involvement or metastatic disease to his right parotid bed/right neck. From a technical standpoint, he may be treated by electron beam  therapy alone or combination of photon and electron beam irradiation. I discussed the potential acute and late toxicities which will depend on his field arrangement and based on his MRI scan findings. He'll require a minimum of 6 weeks of postoperative radiation therapy. We'll get him scheduled for his MRI scan as soon as possible and then have him return for CT simulation/treatment planning. He and his family live in [place], and they would like to be treated here in [place]. Consent is signed today.   PLAN: As discussed above.  I spent 60 minutes minutes face to face with the patient and more than 50% of that time was spent in counseling and/or coordination of care.

## 2013-06-13 ENCOUNTER — Ambulatory Visit (HOSPITAL_COMMUNITY): Payer: Medicare Other | Admitting: Oncology

## 2013-06-13 NOTE — Progress Notes (Signed)
[facility] Psychosocial Distress Screening Clinical Social Work  Clinical Social Work was referred by distress screening protocol.  The patient scored a 8 on the Psychosocial Distress Thermometer which indicates severe distress. Clinical Social Worker Intern telephoned to assess for distress and other psychosocial needs. Patient was not currently at home.  CSWI spoke with Patient's son and was informed that Patient had returned to hospital to address bleeding.  CSWI offered to phone Patient tomorrow for follow up and Patient's son said that would be good.   Clinical Social Worker follow up needed: yes  If yes, follow up plan: Telephone Patient tomorrow, 11/6.   Kayren Holck S. [facility] Clinical Social Work Intern [facility] [facility] [phone]

## 2013-06-14 ENCOUNTER — Ambulatory Visit (HOSPITAL_COMMUNITY): Admission: RE | Admit: 2013-06-14 | Payer: Medicare Other | Source: Ambulatory Visit

## 2013-06-16 ENCOUNTER — Encounter: Payer: Self-pay | Admitting: *Deleted

## 2013-06-18 ENCOUNTER — Ambulatory Visit: Payer: Medicare Other | Admitting: Radiation Oncology

## 2013-06-19 ENCOUNTER — Encounter: Payer: Self-pay | Admitting: Family Medicine

## 2013-06-19 ENCOUNTER — Ambulatory Visit (INDEPENDENT_AMBULATORY_CARE_PROVIDER_SITE_OTHER): Payer: Medicare Other | Admitting: Family Medicine

## 2013-06-19 VITALS — BP 110/74 | Temp 97.6°F | Ht 72.0 in | Wt 173.0 lb

## 2013-06-19 DIAGNOSIS — R609 Edema, unspecified: Secondary | ICD-10-CM

## 2013-06-19 DIAGNOSIS — Z23 Encounter for immunization: Secondary | ICD-10-CM

## 2013-06-19 DIAGNOSIS — R600 Localized edema: Secondary | ICD-10-CM

## 2013-06-19 DIAGNOSIS — J019 Acute sinusitis, unspecified: Secondary | ICD-10-CM

## 2013-06-19 MED ORDER — BENZONATATE 100 MG PO CAPS: 100.0000 mg | ORAL_CAPSULE | Freq: Four times a day (QID) | ORAL | Status: DC | PRN

## 2013-06-19 MED ORDER — AZITHROMYCIN 250 MG PO TABS: ORAL_TABLET | ORAL | Status: DC

## 2013-06-19 NOTE — Patient Instructions (Signed)
Bud, for the swelling you may use furosemide 20 mg as needed for swelling in the feet as needed (may use 1/2 or 1 whole one) if you take it then take one potassium with it  Recheck here in 3 months

## 2013-06-19 NOTE — Progress Notes (Signed)
  Subjective:    Patient ID: Stephen Martin, male    DOB: 05-05-1931, 79 y.o.   MRN: [id number]  HPIFollow up on feet swelling. Patient states swelling is better. He took lasix for 2-3 days but had to stop because he kept running to bathroom.   Having sore throat, cough, and diarrhea.  Started 3 days ago. Taking nyquil.  Patient also relates coughing some. PMH recent visit for pedal edema also has a history of skin cancer actively undergoing surgery and treatment for this.  Review of Systems  Constitutional: Negative for fever and activity change.  HENT: Positive for congestion. Negative for ear pain and rhinorrhea.   Eyes: Negative for discharge.  Respiratory: Positive for cough. Negative for wheezing.   Cardiovascular: Negative for chest pain.       Objective:   Physical Exam  Nursing note and vitals reviewed. Constitutional: He appears well-developed.  HENT:  Head: Normocephalic.  Mouth/Throat: Oropharynx is clear and moist. No oropharyngeal exudate.  Neck: Normal range of motion.  Cardiovascular: Normal rate, regular rhythm and normal heart sounds.   No murmur heard. Pulmonary/Chest: Effort normal and breath sounds normal. He has no wheezes.  Musculoskeletal: He exhibits edema.  Trace in the ankles  Lymphadenopathy:    He has no cervical adenopathy.  Neurological: He exhibits normal muscle tone.  Skin: Skin is warm and dry.          Assessment & Plan:  Pedal edema-is actually much better than what it was. Use Lasix on a when necessary basis half tablet or whole tablet take potassium with it when it occurs  Patient has skin cancer follows up with specialist unfortunately his long-term outlook not good he is doing the best he can at managing at home his wife has Alzheimer's in patient somewhat stretched  I recommend seeing him back in 3 months to see how things are going. He can followup any time sooner if any problems.  Upper history illness possible sinusitis  Zithromax as directed. Followup if fevers or worse.

## 2013-06-21 ENCOUNTER — Other Ambulatory Visit: Payer: Self-pay | Admitting: Family Medicine

## 2013-06-21 MED ORDER — ONDANSETRON HCL 8 MG PO TABS: 8.0000 mg | ORAL_TABLET | Freq: Three times a day (TID) | ORAL | Status: DC | PRN

## 2013-06-21 MED ORDER — CEFPROZIL 500 MG PO TABS: 500.0000 mg | ORAL_TABLET | Freq: Two times a day (BID) | ORAL | Status: DC

## 2013-06-21 NOTE — Progress Notes (Signed)
With some nausea per report from daughter in law, to follow up if ongoing

## 2013-06-22 ENCOUNTER — Ambulatory Visit (HOSPITAL_COMMUNITY): Payer: Medicare Other

## 2013-06-25 ENCOUNTER — Ambulatory Visit (HOSPITAL_COMMUNITY)
Admission: RE | Admit: 2013-06-25 | Discharge: 2013-06-25 | Disposition: A | Discharge status: Home / Self Care | Payer: Medicare Other | Source: Ambulatory Visit | Attending: Oncology | Admitting: Oncology

## 2013-06-25 ENCOUNTER — Encounter: Payer: Self-pay | Admitting: Family Medicine

## 2013-06-25 ENCOUNTER — Ambulatory Visit (INDEPENDENT_AMBULATORY_CARE_PROVIDER_SITE_OTHER): Payer: Medicare Other | Admitting: Family Medicine

## 2013-06-25 VITALS — BP 118/64 | Ht 72.0 in | Wt 175.8 lb

## 2013-06-25 DIAGNOSIS — C3492 Malignant neoplasm of unspecified part of left bronchus or lung: Secondary | ICD-10-CM

## 2013-06-25 DIAGNOSIS — Z85820 Personal history of malignant melanoma of skin: Secondary | ICD-10-CM | POA: Insufficient documentation

## 2013-06-25 DIAGNOSIS — Z9089 Acquired absence of other organs: Secondary | ICD-10-CM | POA: Insufficient documentation

## 2013-06-25 DIAGNOSIS — R222 Localized swelling, mass and lump, trunk: Secondary | ICD-10-CM | POA: Insufficient documentation

## 2013-06-25 DIAGNOSIS — C3490 Malignant neoplasm of unspecified part of unspecified bronchus or lung: Secondary | ICD-10-CM | POA: Insufficient documentation

## 2013-06-25 DIAGNOSIS — J9819 Other pulmonary collapse: Secondary | ICD-10-CM | POA: Insufficient documentation

## 2013-06-25 DIAGNOSIS — I319 Disease of pericardium, unspecified: Secondary | ICD-10-CM | POA: Insufficient documentation

## 2013-06-25 DIAGNOSIS — J9383 Other pneumothorax: Secondary | ICD-10-CM | POA: Insufficient documentation

## 2013-06-25 DIAGNOSIS — Z923 Personal history of irradiation: Secondary | ICD-10-CM | POA: Insufficient documentation

## 2013-06-25 DIAGNOSIS — I700 Atherosclerosis of aorta: Secondary | ICD-10-CM | POA: Insufficient documentation

## 2013-06-25 DIAGNOSIS — R110 Nausea: Secondary | ICD-10-CM

## 2013-06-25 DIAGNOSIS — J90 Pleural effusion, not elsewhere classified: Secondary | ICD-10-CM | POA: Insufficient documentation

## 2013-06-25 MED ORDER — PROMETHAZINE HCL 25 MG PO TABS: 25.0000 mg | ORAL_TABLET | Freq: Four times a day (QID) | ORAL | Status: DC | PRN

## 2013-06-25 NOTE — Progress Notes (Signed)
  Subjective:    Patient ID: Stephen Martin, male    DOB: 1931/06/01, 79 y.o.   MRN: [id number]  HPI Comments: Nausea, vomiting, body aches, cough, swollen eyes, for 7 days now.   patient originally on Zithromax and then switched over Cefzil not having much improvement. Some intermittent nausea. Denies high fever chills. PMH benign Patient undergoing treatment for cancer has a wife with dementia stressed out.   Review of Systems Denies high fever wheezing difficulty breathing relates more nausea than anything else could not afford Zofran    Objective:   Physical Exam Lungs are clear hearts regular neck supple mucous membranes are moist back of throat erythematous. Abdomen soft extremities no edema       Assessment & Plan:  Pharyngitis/upper Restoril this finish antibiotics if throat gets worse use nystatin prescription given to family, Phenergan for nausea cautioned drowsiness  I encouraged patient to allow for home health to come and assess his wife and consider the possibility of being helped out. Patient does not want assisted living currently

## 2013-06-26 ENCOUNTER — Ambulatory Visit (HOSPITAL_COMMUNITY): Payer: Medicare Other

## 2013-06-26 ENCOUNTER — Other Ambulatory Visit (HOSPITAL_COMMUNITY): Payer: Medicare Other

## 2013-06-26 ENCOUNTER — Telehealth: Payer: Self-pay | Admitting: *Deleted

## 2013-06-26 ENCOUNTER — Encounter: Payer: Self-pay | Admitting: Radiation Oncology

## 2013-06-26 NOTE — Telephone Encounter (Signed)
Dr Dayton Scrape reviewed pt's 06/25/13 ct chest. Per Dr Dayton Scrape, pt's ct sim and MR head appointments cancelled. Spoke w/Brian RT and instructed him to cancel ct sim on 06/27/13. Spoke w/Re[facility] radiology dept and cancelled MR head for 06/28/13. Per Dr Dayton Scrape, called pt and informed him that Dr Dayton Scrape has reviewed his ct chest scan results and "there is something that needs further evaluation." Informed pt that Dr Dayton Scrape will call him later today. Pt verbalized understanding. Pt's phone [phone].

## 2013-06-26 NOTE — Progress Notes (Signed)
CC: Dr. Herma Mering, Dr. Lilyan Punt, Jenita Seashore, PA  Chart note: Stephen Martin was scheduled for his CT simulation tomorrow along with a MRI scan for his postoperative radiation therapy to the right preauricular region and neck in the management of his squamous cell carcinoma the skin. He had a followup CT scan at [facility] yesterday which shows an enlarging left pleural effusion with pleural-based nodularity, highly worrisome for metastatic disease. The scan was ordered by Jenita Seashore PA of the [facility] oncology clinic. He appears to have a malignant process within his chest, so I am postponing his staging and scheduling of his postoperative radiation therapy until he has been fully evaluated. I suspect that he has either a new, or recurrent lung cancer, and this process seems to be unrelated to his known squamous cell carcinoma of the right preauricular region. I ask that Jenita Seashore PA keep me posted on his progress. I spoke with the patient earlier this afternoon.

## 2013-06-27 ENCOUNTER — Ambulatory Visit: Payer: Medicare Other | Admitting: Radiation Oncology

## 2013-06-28 ENCOUNTER — Ambulatory Visit (HOSPITAL_COMMUNITY): Payer: Medicare Other

## 2013-06-29 ENCOUNTER — Encounter (HOSPITAL_COMMUNITY): Payer: Self-pay

## 2013-06-29 ENCOUNTER — Encounter (HOSPITAL_COMMUNITY): Payer: Medicare Other | Attending: Hematology and Oncology

## 2013-06-29 VITALS — BP 142/82 | HR 128 | Temp 96.7°F | Resp 16 | Ht 72.0 in | Wt 172.7 lb

## 2013-06-29 DIAGNOSIS — C3410 Malignant neoplasm of upper lobe, unspecified bronchus or lung: Secondary | ICD-10-CM

## 2013-06-29 DIAGNOSIS — Z85820 Personal history of malignant melanoma of skin: Secondary | ICD-10-CM

## 2013-06-29 DIAGNOSIS — J90 Pleural effusion, not elsewhere classified: Secondary | ICD-10-CM

## 2013-06-29 DIAGNOSIS — C3492 Malignant neoplasm of unspecified part of left bronchus or lung: Secondary | ICD-10-CM

## 2013-06-29 DIAGNOSIS — C4359 Malignant melanoma of other part of trunk: Secondary | ICD-10-CM

## 2013-06-29 NOTE — Patient Instructions (Signed)
[facility] Discharge Instructions  RECOMMENDATIONS MADE BY THE CONSULTANT AND ANY TEST RESULTS WILL BE SENT TO YOUR REFERRING PHYSICIAN.  Take phenergan prior to every meal. We will make a referral to Dr.Gerhardt for a VATS procedure. We will plan to see you back at this clinic in 4 weeks.   Thank you for choosing [facility] to provide your oncology and hematology care.  To afford each patient quality time with our providers, please arrive at least 15 minutes before your scheduled appointment time.  With your help, our goal is to use those 15 minutes to complete the necessary work-up to ensure our physicians have the information they need to help with your evaluation and healthcare recommendations.    Effective January 1st, 2014, we ask that you re-schedule your appointment with our physicians should you arrive 10 or more minutes late for your appointment.  We strive to give you quality time with our providers, and arriving late affects you and other patients whose appointments are after yours.    Again, thank you for choosing [facility]  Our hope is that these requests will decrease the amount of time that you wait before being seen by our physicians.       _____________________________________________________________  Should you have questions after your visit to [facility], please contact our office at [phone] between the hours of 8:30 a.m. and 5:00 p.m.  Voicemails left after 4:30 p.m. will not be returned until the following business day.  For prescription refill requests, have your pharmacy contact our office with your prescription refill request.

## 2013-06-29 NOTE — Progress Notes (Signed)
[facility]  OFFICE PROGRESS NOTE  LUKING,SCOTT, MD [address] [place] [address]  DIAGNOSIS: Non-small cell lung cancer, left - Plan: Lactate dehydrogenase, CBC with Differential, Comprehensive metabolic panel  Melanoma of thoracic region, Breslow 1.80mm, shaved  Pleural effusion, left  Chief Complaint  Patient presents with  . Lung Cancer    CURRENT THERAPY: Watchful expectation.  INTERVAL HISTORY: Stephen Martin 79 y.o. male returns for followup of non-small cell lung cancer, status post combined radiation chemotherapy followed by post radiation full dose chemotherapy finishing all treatment on 04/25/2006 with followup since then not revealing any evidence of disease. The patient had a chest wall lesion removed recently in April of 2014 and was found to have a 1.9 mm melanoma which was resected and the patient was seen at [facility] for opinion regarding management. Is also had squamous cell carcinomas removed from the preauricular area on the right as well as the left eyebrow and was to start radiation therapy adjuvantly recently but was found to have abnormalities on the CT scan and was referred back to this office instead. He now presents with weight loss, weakness, anorexia, and nausea for the last 2 weeks. He's lost about 25 pounds over the past 2 months. He denies any fever, night sweats, abdominal pain, diarrhea, constipation, melena, hematochezia, hematuria, lower extremity swelling or redness, PND, orthopnea, or palpitations.    MEDICAL HISTORY: Past Medical History  Diagnosis Date  . Hypertension   . Arteriosclerotic cardiovascular disease (ASCVD)     Echocardiogram in 2008: Mild LVH with normal EF; cardiac cath in 09/2005-nonobstructive coronary disease with 50% LAD  . Nonsustained ventricular tachycardia   . Carotid bruit     Right; Carotid ultrasound in 08/2006: scattered atherosclerotic plaque  without significant focal disease  . Paroxysmal atrial flutter     Apical left ventricular thrombus and 01/2011  . Chronic anticoagulation   . Abdominal wall hematoma     Admitted 01/2011; supratherapeutic INR  . Cardiomyopathy     EF of 35% in 01/2011  . Upper GI bleed   . BPH (benign prostatic hypertrophy) 06/14/2011  . Hx of radiation therapy 03/04/04- 04/22/04    left parotid bed, upper jugular chain 45 Gy, 25 fx, parotid bed boosted 63 Gy 9 fx  . Squamous cell carcinoma 2005    left face, left parotid gland  . History of radiation therapy 09/16/05- 11/05/05    central mediastinum, chest  . Carcinoma, lung 2007    Left upper lobe; NSC, stage III; treated with RT and chemotherapy  . Multiple Skin cancer 06/14/2011  . PUD (peptic ulcer disease)   . Melanoma     chest  . Squamous cell carcinoma 2014    preauricular right ear    INTERIM HISTORY: has HYPERTENSION; Arteriosclerotic cardiovascular disease (ASCVD); Chronic anticoagulation; Atrial fibrillation; Non-small cell lung cancer; Nonsustained ventricular tachycardia; Carotid bruit; Cardiomyopathy; Abdominal wall hematoma; Multiple Skin cancer; BPH (benign prostatic hypertrophy); Orthostatic hypotension; Hx of radiation therapy; and Squamous cell carcinoma on his problem list.   Non-small cell lung cancer status post concomitant radiation and chemotherapy followed by post radiation chemotherapy finishing all therapy as of 04/25/2006  ALLERGIES:  has No Known Allergies.  MEDICATIONS: has a current medication list which includes the following prescription(s): oxybutynin, furosemide, potassium chloride, and promethazine.  SURGICAL HISTORY:  Past Surgical History  Procedure Laterality Date  . Inguinal hernia repair    .  Cholecystectomy    . Laparoscopic gastrotomy w/ repair of ulcer    . Skin lesion    . Skin biopsy    . Mohs surgery  05/01/2013    melanoma of chest  . 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 Skin graft  Left 11/2003    left face  . Neck dissection Left     resection of squamous cell carcinoma-skin    FAMILY HISTORY: family history includes Alzheimer's disease in his father; Cancer in his daughter; Hypertension in his mother; Skin cancer in his sister; Stroke in his mother.  SOCIAL HISTORY:  reports that he has quit smoking. He quit smokeless tobacco use about 60 years ago. He reports that he does not drink alcohol or use illicit drugs.  REVIEW OF SYSTEMS:  Other than that discussed above is noncontributory.  PHYSICAL EXAMINATION: ECOG PERFORMANCE STATUS: 2 - Symptomatic, <50% confined to bed  Blood pressure 142/82, pulse 128, temperature 96.7 F (35.9 C), temperature source Oral, resp. rate 16, height 6' (1.829 m), weight 172 lb 11.2 oz (78.336 kg).  GENERAL:alert, cachectic in appearance. SKIN: Multiple keratoses on the face with near the instruction of the left pain a and evidence of multiple squamous cell carcinomas involving the eyelids, eyebrows and skin graft on the left anterior chest wall. EYES: PERLA; Conjunctiva are pink and non-injected, sclera clear. Left ear newly destroyed from tumor. OROPHARYNX:no exudate, no erythema on lips, buccal mucosa, or tongue. NECK: supple, thyroid normal size, non-tender, without nodularity. No masses CHEST: Increased AP diameter with no breast sounds whatsoever on the left side with dullness to percussion. LYMPH:  no palpable lymphadenopathy in the cervical, axillary or inguinal LUNGS: clear to auscultation and percussion with normal breathing effort HEART: regular rate & rhythm and no murmurs. ABDOMEN:abdomen soft, non-tender and normal bowel sounds MUSCULOSKELETAL:no cyanosis of digits and no clubbing. Range of motion normal.  NEURO: alert & oriented x 3 with fluent speech, no focal motor/sensory deficits   LABORATORY DATA: No visits with results within 30 Day(s) from this visit. Latest known visit with results is:  Office Visit on  05/29/2013  Component Date Value Range Status  . Total Bilirubin 06/06/2013 0.5  0.3 - 1.2 mg/dL Final  . Bilirubin, Direct 06/06/2013 0.1  0.0 - 0.3 mg/dL Final  . Indirect Bilirubin 06/06/2013 0.4  0.0 - 0.9 mg/dL Final  . Alkaline Phosphatase 06/06/2013 90  39 - 117 U/L Final  . AST 06/06/2013 15  0 - 37 U/L Final  . ALT 06/06/2013 9  0 - 53 U/L Final  . Total Protein 06/06/2013 6.4  6.0 - 8.3 g/dL Final  . Albumin 16/05/9603 3.1* 3.5 - 5.2 g/dL Final  . Sodium 54/04/8118 136  135 - 145 mEq/L Final  . Potassium 06/06/2013 4.2  3.5 - 5.3 mEq/L Final  . Chloride 06/06/2013 101  96 - 112 mEq/L Final  . CO2 06/06/2013 28  19 - 32 mEq/L Final  . Glucose, Bld 06/06/2013 88  70 - 99 mg/dL Final  . BUN 14/78/2956 14  6 - 23 mg/dL Final  . Creat 21/30/8657 0.93  0.50 - 1.35 mg/dL Final  . Calcium 84/69/6295 8.4  8.4 - 10.5 mg/dL Final  . Brain Natriuretic Peptide 06/06/2013 212.7* 0.0 - 100.0 pg/mL Final    PATHOLOGY:  Urinalysis    Component Value Date/Time   COLORURINE YELLOW 01/21/2011 2345   APPEARANCEUR CLEAR 01/21/2011 2345   LABSPEC 1.025 01/21/2011 2345  PHURINE 6.0 01/21/2011 2345   GLUCOSEU NEGATIVE 01/21/2011 2345   HGBUR NEGATIVE 01/21/2011 2345   BILIRUBINUR NEGATIVE 01/21/2011 2345   KETONESUR NEGATIVE 01/21/2011 2345   PROTEINUR 30* 01/21/2011 2345   UROBILINOGEN 0.2 01/21/2011 2345   NITRITE NEGATIVE 01/21/2011 2345   LEUKOCYTESUR NEGATIVE 01/21/2011 2345    RADIOGRAPHIC STUDIES: Ct Chest Wo Contrast  06/25/2013   CLINICAL DATA:  Follow up lung cancer diagnosed in 2007 status post radiation therapy to the neck and chest. History of melanoma.  EXAM: CT CHEST WITHOUT CONTRAST  TECHNIQUE: Multidetector CT imaging of the chest was performed following the standard protocol without IV contrast.  COMPARISON:  Chest CTs 06/11/2011 and 06/13/2012.  FINDINGS: Since the prior examination, the left pleural effusion has enlarged, now moderate in size. There is suspicion of at least two  pleural-based masses on the left, measuring 3.6 cm on image 52 and 2.1 cm on image 61. There is a small amount of air within the anterior aspect of the left pleural space inferiorly. There is minimal pleural fluid on the right. A small pericardial effusion is unchanged.  No enlarged mediastinal, hilar or axillary lymph nodes are identified. There is atherosclerosis of the aorta, great vessels and coronary arteries.  Underlying paramediastinal radiation changes are present within both lungs. The components within the right middle lobe and left lower lobe have not significantly changed. There is new ill-defined right upper lobe opacification posteriorly, measuring up to 2.2 cm on image 29. There is slightly greater dependent atelectasis at both lung bases.  The imaged upper abdomen appears unchanged status post cholecystectomy. There is no adrenal mass. There are no worrisome osseous findings.  IMPRESSION: 1. Enlarging left pleural effusion with pleural-based nodularity highly worrisome for metastatic disease. 2. Loculated pneumothorax anteriorly on the left, possibly related to interval thoracentesis (if not recently performed, that would be recommended to evaluate cytology). There is no associated mediastinal shift. 3. New right upper lobe ground-glass opacity may be inflammatory or secondary to atelectasis. The dependent atelectasis at both lung bases has slightly worsened as well. Underlying paramediastinal radiation changes are grossly stable. 4. No evidence of nodal or upper abdominal metastatic disease. These results will be called to the ordering clinician or representative by the Radiologist Assistant, and communication documented in the PACS Dashboard.   Electronically Signed   By: Roxy Horseman [name]   On: 06/25/2013 12:33    ASSESSMENT:  #1. Symptomatic left pleural effusion either due to recurrent lung cancer or metastatic melanoma. #2. History of locally advanced squamous cell carcinoma of the lung,  status post combined modality therapy followed by full dose chemotherapy with last treatment in 2007. #3. 1.9 mm melanoma left anterior chest wall, status post resection at [facility]. No postop treatment recommended. #4. Obstructive pulmonary disease #5. History of multiple squamous cell carcinomas of the skin including but not necessarily limited to the right preauricular area, left pinna, left eyebrow. #6. Multiple actinic keratoses involving the face, scalp, eyebrows and eyelids.   PLAN:  #1. Referral to Dr. Tyrone Sage for VATS with talc pleurodesis to relieve symptoms. Cytology can be done to determine cause of fluid buildup. #2. Continue Phenergan 3 times a day before meals to allow for adequate calories congestion. #3. Followup in one month for discussion of future management.   All questions were answered. The patient knows to call the clinic with any problems, questions or concerns. We can certainly see the patient much sooner if necessary.   I spent 25  minutes counseling the patient face to face. The total time spent in the appointment was 30 minutes.    Maurilio Lovely, MD 06/29/2013 1:57 PM

## 2013-07-03 ENCOUNTER — Telehealth (HOSPITAL_COMMUNITY): Payer: Self-pay

## 2013-07-03 NOTE — Telephone Encounter (Signed)
Cindy at [facility] wanted to let us know that patient will be seeing Dr. Alla German instead of Dr. Tyrone Sage tomorrow.  Dr. Tyrone Sage is not available.

## 2013-07-04 ENCOUNTER — Other Ambulatory Visit: Payer: Self-pay

## 2013-07-04 ENCOUNTER — Institutional Professional Consult (permissible substitution) (INDEPENDENT_AMBULATORY_CARE_PROVIDER_SITE_OTHER): Payer: Medicare Other | Admitting: Cardiothoracic Surgery

## 2013-07-04 ENCOUNTER — Encounter: Payer: Self-pay | Admitting: Cardiothoracic Surgery

## 2013-07-04 VITALS — HR 108 | Resp 18 | Ht 73.0 in | Wt 178.0 lb

## 2013-07-04 DIAGNOSIS — Z85118 Personal history of other malignant neoplasm of bronchus and lung: Secondary | ICD-10-CM

## 2013-07-04 DIAGNOSIS — I509 Heart failure, unspecified: Secondary | ICD-10-CM

## 2013-07-04 DIAGNOSIS — D381 Neoplasm of uncertain behavior of trachea, bronchus and lung: Secondary | ICD-10-CM

## 2013-07-04 DIAGNOSIS — J90 Pleural effusion, not elsewhere classified: Secondary | ICD-10-CM

## 2013-07-04 NOTE — Progress Notes (Signed)
PCP is Stephen Punt, MD Referring Provider is Alla German, MD  Chief Complaint  Patient presents with  . Pleural Effusion    ...h/o lung cancer and treated with chemo/radiaition in 2007...now with left pl eff.  Had CT CHEST 06/25/13   patient examined, CT scan and medical record reviewed  HPI: 79 year old Caucasian male reformed smoker with history of multiple malignancies presents for evaluation of moderate left pleural effusion with prior history of stage III a non-small cell carcinoma the left lower lobe in 2008 treated with chemoradiation. The patient was being evaluated for radiation therapy for his head and neck cancer when a CT scan demonstrated the pleural effusion. Symptomatically the patient is noted decreased exercise tolerance and dyspnea on exertion. He is also noted bilateral ankle edema and was placed on Lasix by his primary care physician.  Review of the CT scan demonstrates two pleural-based masses approximately 2 cm each in the left pleural space with a loculated posterior effusion. There is a pericardial effusion present as well which was interpreted as mild by radiology. There is a 2 cm right upper lobe groundglass density also noted.   The patient recently was treated for melanoma of the anterior chest at [facility] with surgical resection. He recently had a squamous cell carcinoma of the upper back resected which is still healing. He has past history of head and neck cancer of left ear in the past and is currently under evaluation for therapy of squamous cell carcinoma of the right ear canal.  The patient has lost 10 pounds in weight over the past 3 months and has a declining functional status.   The patient has a past history of cardiomyopathy  The last  2-D echo in 2012 demonstrated EF of 30%.  The patient is not appear to be strong enough for major thoracic surgical procedure. Past Medical History  Diagnosis Date  . Hypertension   . Arteriosclerotic  cardiovascular disease (ASCVD)     Echocardiogram in 2008: Mild LVH with normal EF; cardiac cath in 09/2005-nonobstructive coronary disease with 50% LAD  . Nonsustained ventricular tachycardia   . Carotid bruit     Right; Carotid ultrasound in 08/2006: scattered atherosclerotic plaque without significant focal disease  . Paroxysmal atrial flutter     Apical left ventricular thrombus and 01/2011  . Chronic anticoagulation   . Abdominal wall hematoma     Admitted 01/2011; supratherapeutic INR  . Cardiomyopathy     EF of 35% in 01/2011  . Upper GI bleed   . BPH (benign prostatic hypertrophy) 06/14/2011  . Hx of radiation therapy 03/04/04- 04/22/04    left parotid bed, upper jugular chain 45 Gy, 25 fx, parotid bed boosted 63 Gy 9 fx  . Squamous cell carcinoma 2005    left face, left parotid gland  . History of radiation therapy 09/16/05- 11/05/05    central mediastinum, chest  . Carcinoma, lung 2007    Left upper lobe; NSC, stage III; treated with RT and chemotherapy  . Multiple Skin cancer 06/14/2011  . PUD (peptic ulcer disease)   . Melanoma     chest  . 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 Inguinal hernia repair    . Cholecystectomy    . Laparoscopic gastrotomy w/ repair of ulcer    . Skin lesion    . Skin biopsy    . Mohs surgery  05/01/2013    melanoma  of chest  . 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 Skin graft Left 11/2003    left face  . Neck dissection Left     resection of squamous cell carcinoma-skin    Family History  Problem Relation Age of Onset  . Stroke Mother   . Hypertension Mother   . Alzheimer's disease Father   . Skin cancer Sister   . Cancer Daughter     Social History History  Substance Use Topics  . Smoking status: Former Smoker    Types: Cigarettes  . Smokeless tobacco: Former Neurosurgeon    Quit date: 05/29/1953  . Alcohol Use: No    Current Outpatient  Prescriptions  Medication Sig Dispense Refill  . acetaminophen (TYLENOL) 500 MG tablet Take 500 mg by mouth every 6 (six) hours as needed.      . ALPRAZolam (XANAX) 0.5 MG tablet Take 0.5 mg by mouth 2 (two) times daily as needed for anxiety. 1/2 TO 1 TAB BID PRN      . oxybutynin (DITROPAN) 5 MG tablet Take 1 tablet (5 mg total) by mouth daily.  30 tablet  6  . promethazine (PHENERGAN) 25 MG tablet Take 1 tablet (25 mg total) by mouth every 6 (six) hours as needed for nausea or vomiting.  30 tablet  0   No current facility-administered medications for this visit.    No Known Allergies  Review of Systems Patient's wife has dementia and recently had hip surgery for a hip fracture following a fall. She is now back at home The patient's overall functional status is declining and he is progressive dyspnea on exertion and ankle edema now improved on oral Lasix provided by his primary care physician. The patient states he had a coronary angiogram 10 years ago at [place] which showed no major abnormalities. The patient states he has not smoked cigarettes in over 25 years and he does not drink alcohol.   Pulse 108  Resp 18  Ht 6\' 1"  (1.854 m)  Wt 178 lb (80.74 kg)  BMI 23.49 kg/m2  SpO2 96%  Physical Exam Gen. appearance-elderly Caucasian male with did not of multiple head and neck cancers but alert oriented and appropriate HEENT-multiple surgical scars from head and neck cancers, pupils equal, no JVD or palpable adenopathy Thorax-diminished breath sounds left base Cardiac-increased heart rate no murmur or gallop noted no friction rub noted Abdomen-soft nontender without palpable mass Extremities-2+ bilateral pedal edema Neuro-overall weak no focal motor deficit  Diagnostic Tests: CT scans and x-rays reviewed and discussed with patient and family  Impression: Increasing left pleural effusion concerning for recurrent malignancy with history of previous non-small cell carcinoma  left lower lobe treated with chemoradiation in 2008. History of cardiomyopathy low ejection fraction. History of multiple head and neck cancers with recent plan for therapy of his right ear canal cancer. History of resection of chest wall melanoma within the past year at [facility].  Plan: Patient will undergo ultrasound directed left thoracentesis parietal pleural fluid for cytology analysis. We'll attempt to get a PET scan to assess his clinical staging. 2-D echocardiogram will be needed to assess his cardiac function as well as to assess a pericardial effusion area and  After obtaining these data the patient will be assessed for possible left Pleurx catheter, possible pericardial window. He does not appear to be candidate for a VATS procedure with talc pleurodesis.

## 2013-07-06 ENCOUNTER — Ambulatory Visit (HOSPITAL_COMMUNITY)
Admission: RE | Admit: 2013-07-06 | Discharge: 2013-07-06 | Disposition: A | Discharge status: Home / Self Care | Payer: Medicare Other | Source: Ambulatory Visit | Attending: Radiology | Admitting: Radiology

## 2013-07-06 ENCOUNTER — Ambulatory Visit (HOSPITAL_COMMUNITY)
Admission: RE | Admit: 2013-07-06 | Discharge: 2013-07-06 | Disposition: A | Discharge status: Home / Self Care | Payer: Medicare Other | Source: Ambulatory Visit | Attending: Cardiothoracic Surgery | Admitting: Cardiothoracic Surgery

## 2013-07-06 DIAGNOSIS — I379 Nonrheumatic pulmonary valve disorder, unspecified: Secondary | ICD-10-CM

## 2013-07-06 DIAGNOSIS — I509 Heart failure, unspecified: Secondary | ICD-10-CM | POA: Insufficient documentation

## 2013-07-06 DIAGNOSIS — J90 Pleural effusion, not elsewhere classified: Secondary | ICD-10-CM | POA: Insufficient documentation

## 2013-07-06 NOTE — Procedures (Signed)
  US guided L thoracentesis  1.2 Liters yellow fluid obtained Sent for cyto per MD  Pt tolerated well cxr pending  BP 97/63 post procedure

## 2013-07-06 NOTE — Progress Notes (Signed)
  Echocardiogram 2D Echocardiogram has been performed.  Stephen Martin 07/06/2013, 9:26 AM

## 2013-07-09 ENCOUNTER — Telehealth: Payer: Self-pay | Admitting: *Deleted

## 2013-07-09 NOTE — Telephone Encounter (Signed)
Stephen Martin was seen in our office last Wednesday for a new left pleural effusion.  He had an u/s guided thoracentesis on Friday.  His daughter-in-law called to say he is now short of breath again and nauseated.  I suggested to her and Mr. Ginger that since he lives un [place] that he can go to [facility] for treatment and they agreed.

## 2013-07-10 ENCOUNTER — Ambulatory Visit (HOSPITAL_COMMUNITY)
Admission: RE | Admit: 2013-07-10 | Discharge: 2013-07-10 | Disposition: A | Discharge status: Home / Self Care | Payer: Medicare Other | Source: Ambulatory Visit | Attending: Cardiothoracic Surgery | Admitting: Cardiothoracic Surgery

## 2013-07-10 ENCOUNTER — Other Ambulatory Visit: Payer: Self-pay | Admitting: *Deleted

## 2013-07-10 DIAGNOSIS — J90 Pleural effusion, not elsewhere classified: Secondary | ICD-10-CM

## 2013-07-10 DIAGNOSIS — C3490 Malignant neoplasm of unspecified part of unspecified bronchus or lung: Secondary | ICD-10-CM

## 2013-07-12 ENCOUNTER — Ambulatory Visit: Payer: Medicare Other | Admitting: Cardiothoracic Surgery

## 2013-07-13 ENCOUNTER — Ambulatory Visit (HOSPITAL_COMMUNITY): Payer: Medicare Other

## 2013-07-13 ENCOUNTER — Ambulatory Visit (INDEPENDENT_AMBULATORY_CARE_PROVIDER_SITE_OTHER): Payer: Medicare Other | Admitting: Cardiothoracic Surgery

## 2013-07-13 VITALS — BP 125/84 | HR 100 | Resp 16 | Ht 73.0 in | Wt 178.0 lb

## 2013-07-13 DIAGNOSIS — J90 Pleural effusion, not elsewhere classified: Secondary | ICD-10-CM

## 2013-07-13 DIAGNOSIS — R110 Nausea: Secondary | ICD-10-CM

## 2013-07-13 DIAGNOSIS — Z85118 Personal history of other malignant neoplasm of bronchus and lung: Secondary | ICD-10-CM

## 2013-07-13 MED ORDER — ONDANSETRON HCL 4 MG PO TABS: 4.0000 mg | ORAL_TABLET | Freq: Three times a day (TID) | ORAL | Status: DC | PRN

## 2013-07-14 NOTE — Progress Notes (Signed)
PCP is Lilyan Punt, MD Referring Provider is Alla German, MD  Chief Complaint  Patient presents with  . Follow-up    left pleural effusion after u/s guided thoracentesis on 07/06/13 with a cxr    HPI: Patient returns for evaluation of recent left pleural effusion. Past history of stage III left hilar non-small cell carcinoma treated 2 years ago with chemoradiation. Thoracentesis removed 1.2 L of clear fluid with reactive cells, no malignancy. CT scan shows postradiation changes but no clear or discrete mass. Right upper lobe has some nonspecific groundglass density is to appear to be improving with later chest x-ray.  Patient has history of multiple malignancies including recent resection of a Clark level 3-4 melanoma from the chest and multiple squamous cell cutaneous cancers. The patient also has cardiomyopathy low ejection fraction  There is no clear evidence of recurrent thoracic malignancy at this point. A PET scan is still pending.   Past Medical History  Diagnosis Date  . Hypertension   . Arteriosclerotic cardiovascular disease (ASCVD)     Echocardiogram in 2008: Mild LVH with normal EF; cardiac cath in 09/2005-nonobstructive coronary disease with 50% LAD  . Nonsustained ventricular tachycardia   . Carotid bruit     Right; Carotid ultrasound in 08/2006: scattered atherosclerotic plaque without significant focal disease  . Paroxysmal atrial flutter     Apical left ventricular thrombus and 01/2011  . Chronic anticoagulation   . Abdominal wall hematoma     Admitted 01/2011; supratherapeutic INR  . Cardiomyopathy     EF of 35% in 01/2011  . Upper GI bleed   . BPH (benign prostatic hypertrophy) 06/14/2011  . Hx of radiation therapy 03/04/04- 04/22/04    left parotid bed, upper jugular chain 45 Gy, 25 fx, parotid bed boosted 63 Gy 9 fx  . Squamous cell carcinoma 2005    left face, left parotid gland  . History of radiation therapy 09/16/05- 11/05/05    central mediastinum, chest   . Carcinoma, lung 2007    Left upper lobe; NSC, stage III; treated with RT and chemotherapy  . Multiple Skin cancer 06/14/2011  . PUD (peptic ulcer disease)   . Melanoma     chest  . 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 Inguinal hernia repair    . Cholecystectomy    . Laparoscopic gastrotomy w/ repair of ulcer    . Skin lesion    . Skin biopsy    . Mohs surgery  05/01/2013    melanoma of chest  . 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 Skin graft Left 11/2003    left face  . Neck dissection Left     resection of squamous cell carcinoma-skin    Family History  Problem Relation Age of Onset  . Stroke Mother   . Hypertension Mother   . Alzheimer's disease Father   . Skin cancer Sister   . Cancer Daughter     Social History History  Substance Use Topics  . Smoking status: Former Smoker    Types: Cigarettes  . Smokeless tobacco: Former Neurosurgeon    Quit date: 05/29/1953  . Alcohol Use: No    Current Outpatient Prescriptions  Medication Sig Dispense Refill  . acetaminophen (TYLENOL) 500 MG tablet Take 500 mg by mouth every 6 (six) hours as needed.      . ALPRAZolam (XANAX) 0.5 MG tablet Take  0.5 mg by mouth 2 (two) times daily as needed for anxiety. 1/2 TO 1 TAB BID PRN      . oxybutynin (DITROPAN) 5 MG tablet Take 1 tablet (5 mg total) by mouth daily.  30 tablet  6  . promethazine (PHENERGAN) 25 MG tablet Take 1 tablet (25 mg total) by mouth every 6 (six) hours as needed for nausea or vomiting.  30 tablet  0  . ondansetron (ZOFRAN) 4 MG tablet Take 1 tablet (4 mg total) by mouth every 8 (eight) hours as needed for nausea. Deliver to pt's home  20 tablet  1   No current facility-administered medications for this visit.    No Known Allergies  Review of Systems  BP 125/84  Pulse 100  Resp 16  Ht 6\' 1"  (1.854 m)  Wt 178 lb (80.74 kg)  BMI 23.49 kg/m2  SpO2 93% Physical Exam Alert  and comfortable Breath sounds now improved and equal bilaterally  Diagnostic Tests: Chest x-ray shows improvement after left thoracentesis with residual chronic scarring of the left lung from previous radiation  Impression: Left pleural effusion well drained after thoracentesis, nonmalignant by cytology. We'll continue to follow  Patient  to return for evaluation of PET scan in the next.  Plan: Return after PET scan

## 2013-07-16 ENCOUNTER — Telehealth: Payer: Self-pay | Admitting: Family Medicine

## 2013-07-16 ENCOUNTER — Other Ambulatory Visit: Payer: Self-pay

## 2013-07-16 ENCOUNTER — Other Ambulatory Visit: Payer: Self-pay | Admitting: Family Medicine

## 2013-07-16 DIAGNOSIS — C4490 Unspecified malignant neoplasm of skin, unspecified: Secondary | ICD-10-CM

## 2013-07-16 NOTE — Telephone Encounter (Signed)
Please notify the patient that we will do so.

## 2013-07-16 NOTE — Telephone Encounter (Signed)
Patients wants to continue seeing his cancer doctor, Dr [name] @[facility] and they need a referral.

## 2013-07-17 NOTE — Telephone Encounter (Signed)
Notified patient.

## 2013-07-18 ENCOUNTER — Other Ambulatory Visit: Payer: Self-pay

## 2013-07-19 ENCOUNTER — Encounter (HOSPITAL_COMMUNITY)
Admission: RE | Admit: 2013-07-19 | Discharge: 2013-07-19 | Disposition: A | Discharge status: Home / Self Care | Payer: Medicare Other | Source: Ambulatory Visit | Attending: Cardiothoracic Surgery | Admitting: Cardiothoracic Surgery

## 2013-07-19 DIAGNOSIS — D381 Neoplasm of uncertain behavior of trachea, bronchus and lung: Secondary | ICD-10-CM | POA: Insufficient documentation

## 2013-07-19 LAB — GLUCOSE, CAPILLARY: Glucose-Capillary: 92 mg/dL (ref 70–99)

## 2013-07-19 MED ORDER — FLUDEOXYGLUCOSE F - 18 (FDG) INJECTION
18.4000 | Freq: Once | INTRAVENOUS | Status: AC | PRN
  Administered 2013-07-19: 18.4 via INTRAVENOUS

## 2013-07-20 ENCOUNTER — Telehealth: Payer: Self-pay | Admitting: Family Medicine

## 2013-07-20 NOTE — Telephone Encounter (Signed)
Patient has had a change in mental status and where he used to be very sharp, he goes off and no one knows what he is talking about and has periods of shaking. Please advise.

## 2013-07-20 NOTE — Telephone Encounter (Signed)
Stephen Martin verbalized understanding. She said they have an OV in 2 weeks and said they would go to the ER if symptoms got worst.

## 2013-07-20 NOTE — Telephone Encounter (Signed)
Standard OV next week with Family , go to er if worse before then

## 2013-07-25 ENCOUNTER — Other Ambulatory Visit: Payer: Self-pay | Admitting: *Deleted

## 2013-07-25 ENCOUNTER — Encounter: Payer: Self-pay | Admitting: Cardiothoracic Surgery

## 2013-07-25 ENCOUNTER — Ambulatory Visit (INDEPENDENT_AMBULATORY_CARE_PROVIDER_SITE_OTHER): Payer: Medicare Other | Admitting: Cardiothoracic Surgery

## 2013-07-25 ENCOUNTER — Encounter: Payer: Self-pay | Admitting: Family Medicine

## 2013-07-25 VITALS — BP 118/79 | HR 110 | Resp 20 | Ht 73.0 in | Wt 178.0 lb

## 2013-07-25 DIAGNOSIS — Z85118 Personal history of other malignant neoplasm of bronchus and lung: Secondary | ICD-10-CM

## 2013-07-25 DIAGNOSIS — J90 Pleural effusion, not elsewhere classified: Secondary | ICD-10-CM

## 2013-07-25 DIAGNOSIS — R918 Other nonspecific abnormal finding of lung field: Secondary | ICD-10-CM

## 2013-07-25 NOTE — Progress Notes (Signed)
PCP is Lilyan Punt, MD Referring Provider is Babs Sciara, MD  Chief Complaint  Patient presents with  . Pleural Effusion    Discuss PET Scan results     HPI: 79 year old very pleasant gentleman with history of multiple previous malignancies. History of stage III carcinoma left lung treated with radiation and chemotherapy 7 years ago. Now with recurrent left pleural effusion which was tapped and was cytology negative. Significant scarring from previous left lung radiation. PET scan however shows 2 upper lobe and one lower lobe hypermetabolic parenchymal masses. These are certainly potential sites of recurrent lung cancer. He also has recurrence partially of the left pleural effusion following his thoracentesis.  We will plan on CT guided biopsy of at least one of the masses. I explained to the patient and daughter that these are likely malignant but with previous radiation there is a possibility he could have persistent inflammation. He'll return for review after the biopsy is performed. We will get a chest x-ray at that time and if they oral effusion continues increase he may need a Pleurx catheter.   The patient's daughter states his short-term memory has deteriorated over the past 10 days. His last brain MRI was 2013. He may need to have repeated.  Past Medical History  Diagnosis Date  . Hypertension   . Arteriosclerotic cardiovascular disease (ASCVD)     Echocardiogram in 2008: Mild LVH with normal EF; cardiac cath in 09/2005-nonobstructive coronary disease with 50% LAD  . Nonsustained ventricular tachycardia   . Carotid bruit     Right; Carotid ultrasound in 08/2006: scattered atherosclerotic plaque without significant focal disease  . Paroxysmal atrial flutter     Apical left ventricular thrombus and 01/2011  . Chronic anticoagulation   . Abdominal wall hematoma     Admitted 01/2011; supratherapeutic INR  . Cardiomyopathy     EF of 35% in 01/2011  . Upper GI bleed   . BPH  (benign prostatic hypertrophy) 06/14/2011  . Hx of radiation therapy 03/04/04- 04/22/04    left parotid bed, upper jugular chain 45 Gy, 25 fx, parotid bed boosted 63 Gy 9 fx  . Squamous cell carcinoma 2005    left face, left parotid gland  . History of radiation therapy 09/16/05- 11/05/05    central mediastinum, chest  . Carcinoma, lung 2007    Left upper lobe; NSC, stage III; treated with RT and chemotherapy  . Multiple Skin cancer 06/14/2011  . PUD (peptic ulcer disease)   . Melanoma     chest  . 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 Inguinal hernia repair    . Cholecystectomy    . Laparoscopic gastrotomy w/ repair of ulcer    . Skin lesion    . Skin biopsy    . Mohs surgery  05/01/2013    melanoma of chest  . 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 Skin graft Left 11/2003    left face  . Neck dissection Left     resection of squamous cell carcinoma-skin    Family History  Problem Relation Age of Onset  . Stroke Mother   . Hypertension Mother   . Alzheimer's disease Father   . Skin cancer Sister   . Cancer Daughter     Social History History  Substance Use Topics  . Smoking status: Former Smoker    Types: Cigarettes  .  Smokeless tobacco: Former Neurosurgeon    Quit date: 05/29/1953  . Alcohol Use: No    Current Outpatient Prescriptions  Medication Sig Dispense Refill  . acetaminophen (TYLENOL) 500 MG tablet Take 500 mg by mouth every 6 (six) hours as needed.      . ALPRAZolam (XANAX) 0.5 MG tablet Take 0.5 mg by mouth 2 (two) times daily as needed for anxiety. 1/2 TO 1 TAB BID PRN      . meclizine (DRAMAMINE II) 25 MG tablet Take 25 mg by mouth 3 (three) times daily as needed for dizziness or nausea.      Marland Kitchen oxybutynin (DITROPAN) 5 MG tablet Take 1 tablet (5 mg total) by mouth daily.  30 tablet  6  . promethazine (PHENERGAN) 25 MG tablet Take 1 tablet (25 mg total) by mouth every 6 (six)  hours as needed for nausea or vomiting.  30 tablet  0   No current facility-administered medications for this visit.    No Known Allergies  Review of Systems nausea improved on Dramamine The patient denies shortness of breath despite some increase in his left pleural effusion PET scan showed no hypermetabolic activity in the right lung abdominal area or bones  BP 118/79  Pulse 110  Resp 20  Ht 6\' 1"  (1.854 m)  Wt 178 lb (80.74 kg)  BMI 23.49 kg/m2  SpO2 94% Physical Exam Chronically ill but alert and appropriate Slightly diminished left basilar breath sounds Mild ankle edema Heart rate regular  Diagnostic Tests: Results the PET scan discuss with patient and daughter. 2 hypermetabolic rectal masses and left upper lobe and one in the left lower lobe suspicious for recurrent cancer. Patient not be a candidate for surgical resection at this point. We'll proceed with CT-guided lung biopsy and and if positive we'll refer back to his oncologist  Impression: Possible recurrent lung cancer, transthoracic needle biopsy pending  Plan: Return for review of pathology after lung biopsy

## 2013-07-26 ENCOUNTER — Ambulatory Visit (INDEPENDENT_AMBULATORY_CARE_PROVIDER_SITE_OTHER): Payer: Medicare Other | Admitting: Family Medicine

## 2013-07-26 ENCOUNTER — Encounter: Payer: Self-pay | Admitting: Family Medicine

## 2013-07-26 VITALS — BP 126/60 | Ht 72.0 in | Wt 167.0 lb

## 2013-07-26 DIAGNOSIS — C3490 Malignant neoplasm of unspecified part of unspecified bronchus or lung: Secondary | ICD-10-CM

## 2013-07-26 DIAGNOSIS — R000 Tachycardia, unspecified: Secondary | ICD-10-CM

## 2013-07-26 DIAGNOSIS — R634 Abnormal weight loss: Secondary | ICD-10-CM

## 2013-07-26 MED ORDER — METOPROLOL TARTRATE 25 MG PO TABS: ORAL_TABLET | ORAL | Status: DC

## 2013-07-26 NOTE — Progress Notes (Signed)
   Subjective:    Patient ID: Stephen Martin, male    DOB: 1930-11-11, 79 y.o.   MRN: [id number]  HPI Patient is here today for a f/u appt on yesterday's visit with Dr. Donata Clay. He was seen for pleural effusion.  Review of his recent notes both here and with specialist was reviewed patient apparently has reoccurrence of cancer. PMH benign  Review of Systems See above    Objective:   Physical Exam Lungs are clear heart tachycardic 110 120 extremities trace edema skin warm dry abdomen soft neck normal  EKG shows tachycardia no atrial fib     Assessment & Plan:  #1 tachycardia-patient to take metoprolol 25 mg half tablet twice a day followup if ongoing trouble  #2 lung cancer-followup with Dr. Laurie Panda, patient facing needle biopsy in the near future  #3 weakness and weight loss-related to the cancer patient encouraged do a better job of taking in calories 30 minutes spent with patient today  Patient relates a small spot on his foot he is requesting to have it trimmed it's a thick callus he will do this at a followup visit

## 2013-07-27 ENCOUNTER — Other Ambulatory Visit (HOSPITAL_COMMUNITY): Payer: Self-pay | Admitting: Radiology

## 2013-07-27 ENCOUNTER — Ambulatory Visit (HOSPITAL_COMMUNITY): Payer: Medicare Other

## 2013-07-30 ENCOUNTER — Encounter (HOSPITAL_COMMUNITY): Payer: Self-pay | Admitting: Pharmacy Technician

## 2013-07-30 ENCOUNTER — Ambulatory Visit (HOSPITAL_COMMUNITY)
Admission: RE | Admit: 2013-07-30 | Discharge: 2013-07-30 | Disposition: A | Discharge status: Home / Self Care | Payer: Medicare Other | Source: Ambulatory Visit | Attending: Cardiothoracic Surgery | Admitting: Cardiothoracic Surgery

## 2013-07-30 VITALS — BP 121/77 | HR 106 | Temp 97.9°F | Resp 20 | Ht 73.0 in | Wt 162.0 lb

## 2013-07-30 DIAGNOSIS — I2510 Atherosclerotic heart disease of native coronary artery without angina pectoris: Secondary | ICD-10-CM | POA: Insufficient documentation

## 2013-07-30 DIAGNOSIS — R918 Other nonspecific abnormal finding of lung field: Secondary | ICD-10-CM

## 2013-07-30 DIAGNOSIS — I4891 Unspecified atrial fibrillation: Secondary | ICD-10-CM | POA: Insufficient documentation

## 2013-07-30 DIAGNOSIS — C384 Malignant neoplasm of pleura: Secondary | ICD-10-CM | POA: Insufficient documentation

## 2013-07-30 DIAGNOSIS — Z01812 Encounter for preprocedural laboratory examination: Secondary | ICD-10-CM | POA: Insufficient documentation

## 2013-07-30 DIAGNOSIS — I10 Essential (primary) hypertension: Secondary | ICD-10-CM | POA: Insufficient documentation

## 2013-07-30 DIAGNOSIS — I6529 Occlusion and stenosis of unspecified carotid artery: Secondary | ICD-10-CM | POA: Insufficient documentation

## 2013-07-30 DIAGNOSIS — Z85828 Personal history of other malignant neoplasm of skin: Secondary | ICD-10-CM | POA: Insufficient documentation

## 2013-07-30 DIAGNOSIS — Z7901 Long term (current) use of anticoagulants: Secondary | ICD-10-CM | POA: Insufficient documentation

## 2013-07-30 DIAGNOSIS — Z923 Personal history of irradiation: Secondary | ICD-10-CM | POA: Insufficient documentation

## 2013-07-30 DIAGNOSIS — N400 Enlarged prostate without lower urinary tract symptoms: Secondary | ICD-10-CM | POA: Insufficient documentation

## 2013-07-30 DIAGNOSIS — I428 Other cardiomyopathies: Secondary | ICD-10-CM | POA: Insufficient documentation

## 2013-07-30 DIAGNOSIS — Z87891 Personal history of nicotine dependence: Secondary | ICD-10-CM | POA: Insufficient documentation

## 2013-07-30 DIAGNOSIS — Z85118 Personal history of other malignant neoplasm of bronchus and lung: Secondary | ICD-10-CM | POA: Insufficient documentation

## 2013-07-30 LAB — PROTIME-INR
INR: 1.1 (ref 0.00–1.49)
Prothrombin Time: 14 seconds (ref 11.6–15.2)

## 2013-07-30 LAB — CBC
HCT: 39.1 % (ref 39.0–52.0)
Hemoglobin: 12.4 g/dL — ABNORMAL LOW (ref 13.0–17.0)
MCH: 26.8 pg (ref 26.0–34.0)
MCHC: 31.7 g/dL (ref 30.0–36.0)
MCV: 84.6 fL (ref 78.0–100.0)
Platelets: 211 10*3/uL (ref 150–400)
RBC: 4.62 MIL/uL (ref 4.22–5.81)
RDW: 15.5 % (ref 11.5–15.5)
WBC: 14.3 10*3/uL — ABNORMAL HIGH (ref 4.0–10.5)

## 2013-07-30 LAB — APTT: aPTT: 33 seconds (ref 24–37)

## 2013-07-30 MED ORDER — HYDROCODONE-ACETAMINOPHEN 5-325 MG PO TABS
1.0000 | ORAL_TABLET | ORAL | Status: DC | PRN
  Filled 2013-07-30: qty 2

## 2013-07-30 MED ORDER — MIDAZOLAM HCL 2 MG/2ML IJ SOLN
INTRAMUSCULAR | Status: AC
  Filled 2013-07-30: qty 4

## 2013-07-30 MED ORDER — SODIUM CHLORIDE 0.9 % IV SOLN: Freq: Once | INTRAVENOUS | Status: DC

## 2013-07-30 MED ORDER — LIDOCAINE HCL 1 % IJ SOLN
INTRAMUSCULAR | Status: AC
  Filled 2013-07-30: qty 10

## 2013-07-30 MED ORDER — METOPROLOL TARTRATE 1 MG/ML IV SOLN
INTRAVENOUS | Status: AC
  Administered 2013-07-30: 5 mg via INTRAVENOUS
  Filled 2013-07-30: qty 10

## 2013-07-30 MED ORDER — METOPROLOL TARTRATE 1 MG/ML IV SOLN
5.0000 mg | INTRAVENOUS | Status: DC | PRN
  Administered 2013-07-30: 5 mg via INTRAVENOUS
  Filled 2013-07-30: qty 5

## 2013-07-30 MED ORDER — FENTANYL CITRATE 0.05 MG/ML IJ SOLN
INTRAMUSCULAR | Status: AC
  Filled 2013-07-30: qty 4

## 2013-07-30 NOTE — H&P (Signed)
Stephen Martin is an 79 y.o. male.   Chief Complaint: "I'm here for a lung biopsy" HPI: Patient with history of NSC carcinoma left lung , melanoma and squamous cell skin cancer presents today for CT guided biopsy of hypermetabolic left pleural based mass.  Past Medical History  Diagnosis Date  . Hypertension   . Arteriosclerotic cardiovascular disease (ASCVD)     Echocardiogram in 2008: Mild LVH with normal EF; cardiac cath in 09/2005-nonobstructive coronary disease with 50% LAD  . Nonsustained ventricular tachycardia   . Carotid bruit     Right; Carotid ultrasound in 08/2006: scattered atherosclerotic plaque without significant focal disease  . Paroxysmal atrial flutter     Apical left ventricular thrombus and 01/2011  . Chronic anticoagulation   . Abdominal wall hematoma     Admitted 01/2011; supratherapeutic INR  . Cardiomyopathy     EF of 35% in 01/2011  . Upper GI bleed   . BPH (benign prostatic hypertrophy) 06/14/2011  . Hx of radiation therapy 03/04/04- 04/22/04    left parotid bed, upper jugular chain 45 Gy, 25 fx, parotid bed boosted 63 Gy 9 fx  . Squamous cell carcinoma 2005    left face, left parotid gland  . History of radiation therapy 09/16/05- 11/05/05    central mediastinum, chest  . Carcinoma, lung 2007    Left upper lobe; NSC, stage III; treated with RT and chemotherapy  . Multiple Skin cancer 06/14/2011  . PUD (peptic ulcer disease)   . Melanoma     chest  . 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 Inguinal hernia repair    . Cholecystectomy    . Laparoscopic gastrotomy w/ repair of ulcer    . Skin lesion    . Skin biopsy    . Mohs surgery  05/01/2013    melanoma of chest  . 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 Skin graft Left 11/2003    left face  . Neck dissection Left     resection of squamous cell carcinoma-skin    Family History  Problem Relation Age of Onset  . Stroke  Mother   . Hypertension Mother   . Alzheimer's disease Father   . Skin cancer Sister   . Cancer Daughter    Social History:  reports that he has quit smoking. His smoking use included Cigarettes. He smoked 0.00 packs per day. He quit smokeless tobacco use about 60 years ago. He reports that he does not drink alcohol or use illicit drugs.  Allergies: No Known Allergies  Current outpatient prescriptions:acetaminophen (TYLENOL) 500 MG tablet, Take 500 mg by mouth every 6 (six) hours as needed for mild pain. , Disp: , Rfl: ;  oxybutynin (DITROPAN) 5 MG tablet, Take 1 tablet (5 mg total) by mouth daily., Disp: 30 tablet, Rfl: 6 Current facility-administered medications:0.9 %  sodium chloride infusion, , Intravenous, Once, Robet Leu, PA-C   Results for orders placed during the hospital encounter of 07/30/13 (from the past 48 hour(s))  APTT     Status: None   Collection Time    07/30/13  7:42 AM      Result Value Range   aPTT 33  24 - 37 seconds  CBC     Status: Abnormal   Collection Time    07/30/13  7:42 AM      Result Value Range  WBC 14.3 (*) 4.0 - 10.5 K/uL   RBC 4.62  4.22 - 5.81 MIL/uL   Hemoglobin 12.4 (*) 13.0 - 17.0 g/dL   HCT 27.2  53.6 - 64.4 %   MCV 84.6  78.0 - 100.0 fL   MCH 26.8  26.0 - 34.0 pg   MCHC 31.7  30.0 - 36.0 g/dL   RDW 03.4  74.2 - 59.5 %   Platelets 211  150 - 400 K/uL  PROTIME-INR     Status: None   Collection Time    07/30/13  7:42 AM      Result Value Range   Prothrombin Time 14.0  11.6 - 15.2 seconds   INR 1.10  0.00 - 1.49   No results found.  Review of Systems  Constitutional: Positive for weight loss. Negative for fever and chills.  Respiratory: Positive for shortness of breath. Negative for cough and hemoptysis.   Cardiovascular: Negative for chest pain.  Gastrointestinal: Negative for vomiting and abdominal pain.       Occ nausea  Musculoskeletal: Negative for back pain.  Neurological: Positive for weakness. Negative for headaches.   Endo/Heme/Allergies: Bruises/bleeds easily.    Blood pressure 128/77, pulse 62, temperature 97.9 F (36.6 C), temperature source Oral, resp. rate 20, height 6\' 1"  (1.854 m), weight 162 lb (73.483 kg), SpO2 94.00%. Physical Exam  Constitutional: He is oriented to person, place, and time.  Thin WM in NAD  Cardiovascular: Normal rate.   occ ectopy noted  Respiratory: Effort normal.  Dim BS left base, right clear; draining wound left post chest wall- hx recent mole removal per pt  GI: Soft. Bowel sounds are normal. There is no tenderness.  Musculoskeletal: Normal range of motion. He exhibits no edema.  Neurological: He is alert and oriented to person, place, and time.     Assessment/Plan Pt with prior hx of NSC carcinoma left lung, melanoma, squamous cell skin cancer; now with hypermetabolic left pleural based mass. Plan is for CT guided biopsy of the left pleural based mass today. Details/risks of procedure d/w pt with his understanding and consent.  Jozlin Bently,D KEVIN 07/30/2013, 8:26 AM

## 2013-07-30 NOTE — Procedures (Signed)
CT pleural core biopsy 18g x4 to surg path No ptx. No complication No blood loss. See complete dictation in [facility].

## 2013-07-31 ENCOUNTER — Ambulatory Visit (INDEPENDENT_AMBULATORY_CARE_PROVIDER_SITE_OTHER): Payer: Medicare Other | Admitting: Family Medicine

## 2013-07-31 ENCOUNTER — Encounter: Payer: Self-pay | Admitting: Family Medicine

## 2013-07-31 VITALS — BP 110/80 | Ht 72.0 in | Wt 171.1 lb

## 2013-07-31 DIAGNOSIS — R0600 Dyspnea, unspecified: Secondary | ICD-10-CM

## 2013-07-31 DIAGNOSIS — R0609 Other forms of dyspnea: Secondary | ICD-10-CM

## 2013-07-31 NOTE — Progress Notes (Signed)
   Subjective:    Patient ID: Stephen Martin, male    DOB: 1930-08-14, 79 y.o.   MRN: [id number]  Shortness of Breath This is a new problem. The current episode started 1 to 4 weeks ago. The problem occurs intermittently. The problem has been unchanged. Nothing aggravates the symptoms. The patient has no known risk factors for DVT/PE. He has tried nothing for the symptoms. The treatment provided no relief.   Patient states that he is here today to have a callus removed off of his right foot.   Patient did have biopsy of lung mass he awaits the results of this. He does relate some shortness of breath recently but this comes and goes over the past several months he does have known lung cancer.  Review of Systems  Respiratory: Positive for shortness of breath.    patient did state that he felt woozy after getting out of his truck felt like he is going to pass out but it went away this happened a couple days ago     Objective:   Physical Exam Lungs are clear with diminished breath sounds in left base heart is regular abdomen is soft extremities no edema orthostatics were done there is minimal drop when he stands       Assessment & Plan:  #1 lung mass is under the care of oncology await the results of the biopsy  Has a callus on the right foot this was part down with a #15 blade without difficulty #3 dyspnea related to the pleural effusion the cardiothoracic surgeon is going to be planting a tube drain this off  Patient will followup here will

## 2013-08-07 ENCOUNTER — Other Ambulatory Visit: Payer: Self-pay | Admitting: *Deleted

## 2013-08-07 DIAGNOSIS — J90 Pleural effusion, not elsewhere classified: Secondary | ICD-10-CM

## 2013-08-08 ENCOUNTER — Ambulatory Visit: Payer: Medicare Other | Admitting: Cardiothoracic Surgery

## 2013-08-08 ENCOUNTER — Encounter: Payer: Self-pay | Admitting: Family Medicine

## 2013-08-08 ENCOUNTER — Ambulatory Visit (INDEPENDENT_AMBULATORY_CARE_PROVIDER_SITE_OTHER): Payer: Medicare Other | Admitting: Family Medicine

## 2013-08-08 VITALS — BP 118/70 | Temp 97.6°F | Ht 72.0 in | Wt 169.6 lb

## 2013-08-08 DIAGNOSIS — J329 Chronic sinusitis, unspecified: Secondary | ICD-10-CM

## 2013-08-08 MED ORDER — AZITHROMYCIN 250 MG PO TABS: ORAL_TABLET | ORAL | Status: DC

## 2013-08-08 NOTE — Progress Notes (Signed)
   Subjective:    Patient ID: Stephen Martin, male    DOB: 31-Jan-1931, 79 y.o.   MRN: [id number]  Sinusitis This is a new problem. The current episode started in the past 7 days. There has been no fever. Associated symptoms include congestion, coughing, headaches, sinus pressure and sneezing. Past treatments include nothing.   Headache all stopped up  otc frontal  Some gunkiness when sneezing  Also nno headacheno sig chest congestion  Flu shot already given   Review of Systems  HENT: Positive for congestion, sinus pressure and sneezing.   Respiratory: Positive for cough.   Neurological: Positive for headaches.       Objective:   Physical Exam  Alert alert hydration good. Vital stable. Mild malaise. Frontal maxillary tenderness. Nasal discharge. Pharynx normal neck supple. Lungs clear heart regular rate and rhythm. Impression      Assessment & Plan:  Impression acute rhinosinusitis plan Z-Pak. Symptomatic care discussed. Warning signs discussed. WSL

## 2013-08-15 ENCOUNTER — Ambulatory Visit: Payer: Medicare Other | Admitting: Cardiothoracic Surgery

## 2013-08-17 ENCOUNTER — Encounter: Payer: Self-pay | Admitting: Family Medicine

## 2013-08-17 ENCOUNTER — Encounter (HOSPITAL_COMMUNITY): Payer: Self-pay | Admitting: Pharmacy Technician

## 2013-08-18 ENCOUNTER — Emergency Department (HOSPITAL_COMMUNITY): Payer: Medicare Other

## 2013-08-18 ENCOUNTER — Inpatient Hospital Stay (HOSPITAL_COMMUNITY)
Admission: EM | Admit: 2013-08-18 | Discharge: 2013-08-19 | DRG: 391 | Disposition: A | Discharge status: Home / Self Care | Payer: Medicare Other | Attending: Internal Medicine | Admitting: Internal Medicine

## 2013-08-18 ENCOUNTER — Encounter (HOSPITAL_COMMUNITY): Payer: Self-pay | Admitting: Emergency Medicine

## 2013-08-18 DIAGNOSIS — I2589 Other forms of chronic ischemic heart disease: Secondary | ICD-10-CM | POA: Diagnosis present

## 2013-08-18 DIAGNOSIS — Z808 Family history of malignant neoplasm of other organs or systems: Secondary | ICD-10-CM

## 2013-08-18 DIAGNOSIS — E876 Hypokalemia: Secondary | ICD-10-CM | POA: Diagnosis present

## 2013-08-18 DIAGNOSIS — Z7982 Long term (current) use of aspirin: Secondary | ICD-10-CM

## 2013-08-18 DIAGNOSIS — I4891 Unspecified atrial fibrillation: Secondary | ICD-10-CM

## 2013-08-18 DIAGNOSIS — Z87891 Personal history of nicotine dependence: Secondary | ICD-10-CM

## 2013-08-18 DIAGNOSIS — R627 Adult failure to thrive: Secondary | ICD-10-CM | POA: Diagnosis present

## 2013-08-18 DIAGNOSIS — K8689 Other specified diseases of pancreas: Secondary | ICD-10-CM

## 2013-08-18 DIAGNOSIS — R000 Tachycardia, unspecified: Secondary | ICD-10-CM

## 2013-08-18 DIAGNOSIS — I4892 Unspecified atrial flutter: Secondary | ICD-10-CM | POA: Diagnosis present

## 2013-08-18 DIAGNOSIS — Z8249 Family history of ischemic heart disease and other diseases of the circulatory system: Secondary | ICD-10-CM

## 2013-08-18 DIAGNOSIS — IMO0002 Reserved for concepts with insufficient information to code with codable children: Secondary | ICD-10-CM

## 2013-08-18 DIAGNOSIS — J90 Pleural effusion, not elsewhere classified: Secondary | ICD-10-CM

## 2013-08-18 DIAGNOSIS — J189 Pneumonia, unspecified organism: Secondary | ICD-10-CM

## 2013-08-18 DIAGNOSIS — C3490 Malignant neoplasm of unspecified part of unspecified bronchus or lung: Secondary | ICD-10-CM | POA: Diagnosis present

## 2013-08-18 DIAGNOSIS — Z85828 Personal history of other malignant neoplasm of skin: Secondary | ICD-10-CM

## 2013-08-18 DIAGNOSIS — Z823 Family history of stroke: Secondary | ICD-10-CM

## 2013-08-18 DIAGNOSIS — R110 Nausea: Secondary | ICD-10-CM

## 2013-08-18 DIAGNOSIS — C4490 Unspecified malignant neoplasm of skin, unspecified: Secondary | ICD-10-CM

## 2013-08-18 DIAGNOSIS — R0989 Other specified symptoms and signs involving the circulatory and respiratory systems: Secondary | ICD-10-CM

## 2013-08-18 DIAGNOSIS — I429 Cardiomyopathy, unspecified: Secondary | ICD-10-CM

## 2013-08-18 DIAGNOSIS — J910 Malignant pleural effusion: Secondary | ICD-10-CM

## 2013-08-18 DIAGNOSIS — I428 Other cardiomyopathies: Secondary | ICD-10-CM

## 2013-08-18 DIAGNOSIS — R112 Nausea with vomiting, unspecified: Principal | ICD-10-CM | POA: Diagnosis present

## 2013-08-18 DIAGNOSIS — I472 Ventricular tachycardia: Secondary | ICD-10-CM

## 2013-08-18 DIAGNOSIS — C3492 Malignant neoplasm of unspecified part of left bronchus or lung: Secondary | ICD-10-CM

## 2013-08-18 DIAGNOSIS — Z820 Family history of epilepsy and other diseases of the nervous system: Secondary | ICD-10-CM

## 2013-08-18 DIAGNOSIS — I10 Essential (primary) hypertension: Secondary | ICD-10-CM

## 2013-08-18 DIAGNOSIS — R1084 Generalized abdominal pain: Secondary | ICD-10-CM

## 2013-08-18 DIAGNOSIS — I4729 Other ventricular tachycardia: Secondary | ICD-10-CM

## 2013-08-18 DIAGNOSIS — Z9089 Acquired absence of other organs: Secondary | ICD-10-CM

## 2013-08-18 DIAGNOSIS — Z85820 Personal history of malignant melanoma of skin: Secondary | ICD-10-CM

## 2013-08-18 DIAGNOSIS — I2510 Atherosclerotic heart disease of native coronary artery without angina pectoris: Secondary | ICD-10-CM

## 2013-08-18 DIAGNOSIS — I951 Orthostatic hypotension: Secondary | ICD-10-CM

## 2013-08-18 DIAGNOSIS — Z7901 Long term (current) use of anticoagulants: Secondary | ICD-10-CM

## 2013-08-18 DIAGNOSIS — Z923 Personal history of irradiation: Secondary | ICD-10-CM

## 2013-08-18 DIAGNOSIS — N400 Enlarged prostate without lower urinary tract symptoms: Secondary | ICD-10-CM

## 2013-08-18 HISTORY — DX: Unspecified atrial fibrillation: I48.91

## 2013-08-18 HISTORY — DX: Malignant neoplasm of unspecified part of unspecified bronchus or lung: C34.90

## 2013-08-18 HISTORY — DX: Other specified diseases of pancreas: K86.89

## 2013-08-18 LAB — COMPREHENSIVE METABOLIC PANEL
ALT: 6 U/L (ref 0–53)
AST: 18 U/L (ref 0–37)
Albumin: 2.7 g/dL — ABNORMAL LOW (ref 3.5–5.2)
Alkaline Phosphatase: 104 U/L (ref 39–117)
BILIRUBIN TOTAL: 0.5 mg/dL (ref 0.3–1.2)
BUN: 13 mg/dL (ref 6–23)
CHLORIDE: 96 meq/L (ref 96–112)
CO2: 28 mEq/L (ref 19–32)
Calcium: 8.6 mg/dL (ref 8.4–10.5)
Creatinine, Ser: 0.88 mg/dL (ref 0.50–1.35)
GFR, EST NON AFRICAN AMERICAN: 79 mL/min — AB (ref >90)
GLUCOSE: 95 mg/dL (ref 70–99)
Potassium: 3.5 mEq/L — ABNORMAL LOW (ref 3.7–5.3)
Sodium: 136 mEq/L — ABNORMAL LOW (ref 137–147)
Total Protein: 8.3 g/dL (ref 6.0–8.3)

## 2013-08-18 LAB — CBC WITH DIFFERENTIAL/PLATELET
Basophils Absolute: 0 10*3/uL (ref 0.0–0.1)
Basophils Relative: 0 % (ref 0–1)
Eosinophils Absolute: 1.5 10*3/uL — ABNORMAL HIGH (ref 0.0–0.7)
Eosinophils Relative: 13 % — ABNORMAL HIGH (ref 0–5)
HCT: 39 % (ref 39.0–52.0)
HEMOGLOBIN: 11.9 g/dL — AB (ref 13.0–17.0)
Lymphocytes Relative: 12 % (ref 12–46)
Lymphs Abs: 1.4 10*3/uL (ref 0.7–4.0)
MCH: 25.5 pg — ABNORMAL LOW (ref 26.0–34.0)
MCHC: 30.5 g/dL (ref 30.0–36.0)
MCV: 83.5 fL (ref 78.0–100.0)
MONO ABS: 0.6 10*3/uL (ref 0.1–1.0)
MONOS PCT: 5 % (ref 3–12)
NEUTROS ABS: 8 10*3/uL — AB (ref 1.7–7.7)
Neutrophils Relative %: 70 % (ref 43–77)
Platelets: 283 10*3/uL (ref 150–400)
RBC: 4.67 MIL/uL (ref 4.22–5.81)
RDW: 16 % — ABNORMAL HIGH (ref 11.5–15.5)
WBC: 11.4 10*3/uL — AB (ref 4.0–10.5)

## 2013-08-18 LAB — MAGNESIUM: MAGNESIUM: 1.6 mg/dL (ref 1.5–2.5)

## 2013-08-18 LAB — LIPASE, BLOOD: Lipase: 10 U/L — ABNORMAL LOW (ref 11–59)

## 2013-08-18 LAB — TSH: TSH: 15.161 u[IU]/mL — ABNORMAL HIGH (ref 0.350–4.500)

## 2013-08-18 LAB — LACTIC ACID, PLASMA: Lactic Acid, Venous: 1.5 mmol/L (ref 0.5–2.2)

## 2013-08-18 MED ORDER — VANCOMYCIN HCL 10 G IV SOLR
1500.0000 mg | Freq: Once | INTRAVENOUS | Status: AC
  Administered 2013-08-18: 1500 mg via INTRAVENOUS
  Filled 2013-08-18: qty 1500

## 2013-08-18 MED ORDER — ONDANSETRON HCL 4 MG/2ML IJ SOLN: 4.0000 mg | Freq: Four times a day (QID) | INTRAMUSCULAR | Status: DC | PRN

## 2013-08-18 MED ORDER — LORAZEPAM 0.5 MG PO TABS
0.5000 mg | ORAL_TABLET | Freq: Once | ORAL | Status: AC
  Administered 2013-08-18: 0.5 mg via ORAL
  Filled 2013-08-18: qty 1

## 2013-08-18 MED ORDER — IOHEXOL 300 MG/ML  SOLN
100.0000 mL | Freq: Once | INTRAMUSCULAR | Status: AC | PRN
  Administered 2013-08-18: 100 mL via INTRAVENOUS

## 2013-08-18 MED ORDER — PANTOPRAZOLE SODIUM 40 MG PO TBEC
40.0000 mg | DELAYED_RELEASE_TABLET | Freq: Every day | ORAL | Status: DC
  Administered 2013-08-19: 40 mg via ORAL
  Filled 2013-08-18: qty 1

## 2013-08-18 MED ORDER — PANCRELIPASE (LIP-PROT-AMYL) 12000-38000 UNITS PO CPEP
1.0000 | ORAL_CAPSULE | Freq: Three times a day (TID) | ORAL | Status: DC
  Administered 2013-08-18 – 2013-08-19 (×3): 1 via ORAL
  Filled 2013-08-18 (×3): qty 1

## 2013-08-18 MED ORDER — ACETAMINOPHEN 650 MG RE SUPP: 650.0000 mg | Freq: Four times a day (QID) | RECTAL | Status: DC | PRN

## 2013-08-18 MED ORDER — IOHEXOL 300 MG/ML  SOLN
50.0000 mL | Freq: Once | INTRAMUSCULAR | Status: AC | PRN
  Administered 2013-08-18: 50 mL via ORAL

## 2013-08-18 MED ORDER — POTASSIUM CHLORIDE CRYS ER 20 MEQ PO TBCR
20.0000 meq | EXTENDED_RELEASE_TABLET | Freq: Two times a day (BID) | ORAL | Status: AC
  Administered 2013-08-18 (×2): 20 meq via ORAL
  Filled 2013-08-18: qty 1
  Filled 2013-08-18: qty 2

## 2013-08-18 MED ORDER — LEVOFLOXACIN IN D5W 750 MG/150ML IV SOLN
750.0000 mg | INTRAVENOUS | Status: DC
  Administered 2013-08-18 – 2013-08-19 (×2): 750 mg via INTRAVENOUS
  Filled 2013-08-18 (×2): qty 150

## 2013-08-18 MED ORDER — SODIUM CHLORIDE 0.9 % IV SOLN
1000.0000 mL | Freq: Once | INTRAVENOUS | Status: AC
Start: 2013-08-18 — End: 2013-08-18
  Administered 2013-08-18: 1000 mL via INTRAVENOUS

## 2013-08-18 MED ORDER — ALUM & MAG HYDROXIDE-SIMETH 200-200-20 MG/5ML PO SUSP: 30.0000 mL | Freq: Four times a day (QID) | ORAL | Status: DC | PRN

## 2013-08-18 MED ORDER — POTASSIUM CHLORIDE IN NACL 20-0.9 MEQ/L-% IV SOLN
INTRAVENOUS | Status: DC
  Administered 2013-08-18: 50 mL via INTRAVENOUS
  Administered 2013-08-19: 07:00:00 via INTRAVENOUS

## 2013-08-18 MED ORDER — LEVALBUTEROL HCL 0.63 MG/3ML IN NEBU: 0.6300 mg | INHALATION_SOLUTION | Freq: Four times a day (QID) | RESPIRATORY_TRACT | Status: DC | PRN

## 2013-08-18 MED ORDER — ALPRAZOLAM 0.5 MG PO TABS
0.5000 mg | ORAL_TABLET | Freq: Two times a day (BID) | ORAL | Status: DC | PRN
  Administered 2013-08-19: 0.5 mg via ORAL
  Filled 2013-08-18: qty 1

## 2013-08-18 MED ORDER — SODIUM CHLORIDE 0.9 % IV SOLN
1000.0000 mL | INTRAVENOUS | Status: DC
  Administered 2013-08-18: 1000 mL via INTRAVENOUS

## 2013-08-18 MED ORDER — VANCOMYCIN HCL IN DEXTROSE 1-5 GM/200ML-% IV SOLN
1000.0000 mg | Freq: Two times a day (BID) | INTRAVENOUS | Status: DC
  Administered 2013-08-19: 1000 mg via INTRAVENOUS
  Filled 2013-08-18 (×3): qty 200

## 2013-08-18 MED ORDER — ACETAMINOPHEN 325 MG PO TABS: 650.0000 mg | ORAL_TABLET | Freq: Four times a day (QID) | ORAL | Status: DC | PRN

## 2013-08-18 MED ORDER — LISINOPRIL 5 MG PO TABS
5.0000 mg | ORAL_TABLET | Freq: Every day | ORAL | Status: DC
Start: 2013-08-18 — End: 2013-08-19
  Administered 2013-08-18 – 2013-08-19 (×2): 5 mg via ORAL
  Filled 2013-08-18 (×2): qty 1

## 2013-08-18 MED ORDER — SODIUM CHLORIDE 0.9 % IV SOLN
Freq: Once | INTRAVENOUS | Status: AC
  Administered 2013-08-18: 08:00:00 via INTRAVENOUS

## 2013-08-18 MED ORDER — ONDANSETRON HCL 4 MG PO TABS: 4.0000 mg | ORAL_TABLET | Freq: Four times a day (QID) | ORAL | Status: DC | PRN

## 2013-08-18 MED ORDER — OXYCODONE HCL 5 MG PO TABS: 5.0000 mg | ORAL_TABLET | ORAL | Status: DC | PRN

## 2013-08-18 MED ORDER — PANTOPRAZOLE SODIUM 40 MG IV SOLR
40.0000 mg | Freq: Once | INTRAVENOUS | Status: AC
  Administered 2013-08-18: 40 mg via INTRAVENOUS
  Filled 2013-08-18: qty 40

## 2013-08-18 MED ORDER — MAGNESIUM SULFATE 40 MG/ML IJ SOLN
1.0000 g | Freq: Once | INTRAMUSCULAR | Status: AC
  Administered 2013-08-18: 1 g via INTRAVENOUS
  Filled 2013-08-18: qty 50

## 2013-08-18 MED ORDER — MORPHINE SULFATE 4 MG/ML IJ SOLN: 4.0000 mg | INTRAMUSCULAR | Status: DC | PRN

## 2013-08-18 MED ORDER — CARVEDILOL 3.125 MG PO TABS
6.2500 mg | ORAL_TABLET | Freq: Two times a day (BID) | ORAL | Status: DC
  Administered 2013-08-18 – 2013-08-19 (×2): 6.25 mg via ORAL
  Filled 2013-08-18 (×2): qty 2

## 2013-08-18 MED ORDER — OXYBUTYNIN CHLORIDE 5 MG PO TABS
5.0000 mg | ORAL_TABLET | Freq: Every day | ORAL | Status: DC
  Administered 2013-08-18 – 2013-08-19 (×2): 5 mg via ORAL
  Filled 2013-08-18 (×2): qty 1

## 2013-08-18 MED ORDER — DOCUSATE SODIUM 100 MG PO CAPS
100.0000 mg | ORAL_CAPSULE | Freq: Two times a day (BID) | ORAL | Status: DC
  Administered 2013-08-18 – 2013-08-19 (×3): 100 mg via ORAL
  Filled 2013-08-18 (×3): qty 1

## 2013-08-18 MED ORDER — MAGNESIUM SULFATE 50 % IJ SOLN: 1.0000 g | Freq: Once | INTRAVENOUS | Status: DC

## 2013-08-18 NOTE — H&P (Signed)
[facility] History and Physical  TRACY KINNER JAS:[id number] DOB: 1931/08/03 DOA: 08/18/2013  Referring physician: ED physician, Dr. Roderic Palau PCP: [name], MD  Oncologist: Dr. Tressie Stalker CVTS: Dr. Nils Pyle  Chief Complaint: Abdominal pain, nausea, occasional vomiting, and shortness of breath.  HPI: Stephen Martin is a 80 y.o. male with a history of multiple malignancies including non-small cell lung cancer, malignant melanoma, and multiple squamous cell cutaneous cancers, CAD and cardiomyopathy, who presents to the emergency department with a complaint of abdominal pain, nausea, occasional vomiting, and shortness of breath. The patient has had these symptoms intermittently for the past 2 weeks. Last night, he developed shortness of breath which concerned him. His shortness of breath occurred when he was lying flat and when he tried to increase his activity. He has had a cough which has been nonproductive. He denies chest pain or pleurisy. He did feel his heart race last night and this morning. He denies subjective fever or chills. He denies body aches. Subsequently, he developed abdominal pain that started in the epigastrium and radiated to below the umbilicus. This is associated with nausea and a couple episodes of vomiting. He denies coffee grounds emesis. His last bowel movement was yesterday and was considered normal. He denies constipation or diarrhea. He denies black tarry stools or bright red blood per rectum.  In the emergency department, he is afebrile and tachycardic with a heart rate from 85-140 beats per minute. His EKG reveals sinus tachycardia with a heart rate of 120 beats per minute. His blood pressure is within normal limits. His chest x-ray revealed moderately large left effusion and patchy bilateral air space process concerning for pneumonia and chronic left upper lobe scarring. CT of his abdomen and pelvis reveals a large partially loculated left pleural effusion; status  post cholecystectomy; fatty replacement of the pancreas; enlarged prostate; and diverticula without evidence of diverticulitis. His lab data are significant for a potassium of 3.5 low lipase and a CBC of 11.4. He is being in bed for further evaluation and management.    Review of Systems:  Constitutional:  He has had approximately 10 pound weight loss; he denies night sweats, Fevers, chills; he has had fatigue.  HEENT:  No headaches, Difficulty swallowing,Tooth/dental problems,Sore throat,  No sneezing, itching, ear ache, nasal congestion, post nasal drip,  Cardio-vascular:  No chest pain,  PND, swelling in lower extremities, anasarca, dizziness; he has had orthopnea and palpitations. GI:  No heartburn, indigestion; he has had abdominal pain, nausea, vomiting; no diarrhea Resp:  He has had shortness of breath with exertion or at rest. No excess mucus, no productive cough; No coughing up of blood.No change in color of mucus.No wheezing.No chest wall deformity  Skin:  He has multiple scarring from excision of squamous cell carcinomas.  GU:  no dysuria, change in color of urine, no urgency or frequency. No flank pain.  Musculoskeletal:  No joint pain or swelling. No decreased range of motion. No back pain.   Past Medical History  Diagnosis Date  . Hypertension   . Arteriosclerotic cardiovascular disease (ASCVD)     Echocardiogram in 2008: Mild LVH with normal EF; cardiac cath in 09/2005-nonobstructive coronary disease with 50% LAD  . Nonsustained ventricular tachycardia   . Carotid bruit     Right; Carotid ultrasound in 08/2006: scattered atherosclerotic plaque without significant focal disease  . Paroxysmal atrial flutter     Apical left ventricular thrombus and 01/2011  . Chronic anticoagulation   . Abdominal wall hematoma  Admitted 01/2011; supratherapeutic INR  . Cardiomyopathy     EF of 20-25 % 06/2013 and 30% in 01/2011  . Upper GI bleed   . BPH (benign prostatic hypertrophy)  06/14/2011  . Hx of radiation therapy 03/04/04- 04/22/04    left parotid bed, upper jugular chain 45 Gy, 25 fx, parotid bed boosted 63 Gy 9 fx  . Squamous cell carcinoma 2005    left face, left parotid gland  . History of radiation therapy 09/16/05- 11/05/05    central mediastinum, chest  . Carcinoma, lung 2007    Left upper lobe; Crooks, stage III; treated with RT and chemotherapy  . Multiple Skin cancer 06/14/2011  . PUD (peptic ulcer disease)   . Melanoma     chest  . 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 Atrial fibrillation   . Non-small cell lung cancer     Left upper lobe; treated with concomitant radiation and chemotherapy, then post radiation chemotherapy finishing all as of 04/25/2006 without recurrence.    Past Surgical History  Procedure Laterality Date  . Inguinal hernia repair    . Cholecystectomy    . Laparoscopic gastrotomy w/ repair of ulcer    . Skin lesion    . Skin biopsy    . Mohs surgery  05/01/2013    melanoma of chest  . 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 Skin graft Left 11/2003    left face  . Neck dissection Left     resection of squamous cell carcinoma-skin  . Lung biopsy     Social History:  reports that he has quit smoking. His smoking use included Cigarettes. He smoked 0.00 packs per day. He quit smokeless tobacco use about 60 years ago. He reports that he does not drink alcohol or use illicit drugs.  He is retired. He lives in [place].  Allergies  Allergen Reactions  . Amoxil [Amoxicillin]     RASH  . Cefzil [Cefprozil]     Family History  Problem Relation Age of Onset  . Stroke Mother   . Hypertension Mother   . Alzheimer's disease Father   . Skin cancer Sister   . Cancer Daughter      Prior to Admission medications   Medication Sig Start Date End Date Taking? Authorizing Provider  acetaminophen (TYLENOL) 500 MG tablet Take 500 mg by mouth every 6 (six) hours as needed for mild pain.    Yes Historical  Provider, MD  aspirin EC 81 MG tablet Take 81 mg by mouth daily.   Yes Historical Provider, MD  oxybutynin (DITROPAN) 5 MG tablet Take 1 tablet (5 mg total) by mouth daily. 05/29/13  Yes Kathyrn Drown, MD  ALPRAZolam Duanne Moron) 0.5 MG tablet Take 0.5 mg by mouth 2 (two) times daily as needed for anxiety.     Historical Provider, MD   Physical Exam: Filed Vitals:   08/18/13 1359  BP: 146/90  Pulse: 139  Temp: 97.2 F (36.2 C)  Resp: 24    BP 146/90  Pulse 139  Temp(Src) 97.2 F (36.2 C) (Axillary)  Resp 24  SpO2 95%  General:  Appears calm and comfortable Eyes: PERRL, normal lids, irises & conjunctiva ENT: grossly normal hearing, lips & tongue; mucous membranes are dry. No teeth. No posterior exudates or erythema. Neck: no LAD, masses or thyromegaly Cardiovascular: S1, S2, with tachycardia with a 1-9/3 systolic murmur. No pedal edema. Telemetry: Sinus tachycardia.  Respiratory:  Rare crackles in the mid lobes with globally decreased breath sounds on the left. Abdomen: soft, positive bowel sounds, soft, minimal hypogastric tenderness, no significant distention. Skin: Multiple hypopigmented areas on his face and his arms from previous excision of cutaneous squamous cell carcinomas Musculoskeletal: grossly normal; no acute hot red joints. Psychiatric: Flat affect; speech fluent and appropriate Neurologic: grossly non-focal.cranial nerves II through XII are intact.           Labs on Admission:  Basic Metabolic Panel:  Recent Labs Lab 08/18/13 0805  NA 136*  K 3.5*  CL 96  CO2 28  GLUCOSE 95  BUN 13  CREATININE 0.88  CALCIUM 8.6   Liver Function Tests:  Recent Labs Lab 08/18/13 0805  AST 18  ALT 6  ALKPHOS 104  BILITOT 0.5  PROT 8.3  ALBUMIN 2.7*    Recent Labs Lab 08/18/13 0805  LIPASE 10*   No results found for this basename: AMMONIA,  in the last 168 hours CBC:  Recent Labs Lab 08/18/13 0805  WBC 11.4*  NEUTROABS 8.0*  HGB 11.9*  HCT 39.0  MCV  83.5  PLT 283   Cardiac Enzymes: No results found for this basename: CKTOTAL, CKMB, CKMBINDEX, TROPONINI,  in the last 168 hours  BNP (last 3 results) No results found for this basename: PROBNP,  in the last 8760 hours CBG: No results found for this basename: GLUCAP,  in the last 168 hours  Radiological Exams on Admission: Dg Chest 2 View  08/18/2013   CLINICAL DATA:  Lung cancer and melanoma history, weakness, abdominal pain  EXAM: CHEST  2 VIEW  COMPARISON:  08/18/2013, 07/10/2013  FINDINGS: Moderately large left effusion noted, better demonstrated on today's CT of the abdomen. Increased patchy bibasilar airspace process in both lower lobes and left upper lobe. Chronic scarring left upper lobe as well. No pneumothorax. Findings concerning for superimposed pneumonia by plain radiography.  IMPRESSION: Moderately large left effusion better demonstrated by CT  Patchy bilateral airspace process concerning for pneumonia.  Chronic left upper lobe scarring   Electronically Signed   By: Daryll Brod [name]   On: 08/18/2013 10:57   Ct Abdomen Pelvis W Contrast  08/18/2013   CLINICAL DATA:  Abdominal pain, nausea, history of lung cancer, skin cancer  EXAM: CT ABDOMEN AND PELVIS WITH CONTRAST  TECHNIQUE: Multidetector CT imaging of the abdomen and pelvis was performed using the standard protocol following bolus administration of intravenous contrast.  CONTRAST:  32mL OMNIPAQUE IOHEXOL 300 MG/ML SOLN, 135mL OMNIPAQUE IOHEXOL 300 MG/ML SOLN  COMPARISON:  07/09/2013  FINDINGS: Sagittal images of the spine shows diffuse osteopenia. Degenerative changes thoracolumbar spine. Again noted loculated large left pleural effusion. Again noted left pleural lesion posteriorly measures about 2.7 cm without significant change from prior exam. Small pericardial effusion. Bilateral lower lobe atelectasis or scarring.  The patient is status post cholecystectomy. No focal hepatic mass. Fatty replaced pancreas is noted. The spleen  and adrenal glands are unremarkable.  Kidneys are symmetrical in size and enhancement. Left renal cysts are stable from prior exam. Stable minimal distension of left ureter.  Atherosclerotic calcifications of abdominal aorta and iliac arteries.  Delayed renal images shows bilateral renal symmetrical excretion.  No small bowel obstruction. No ascites or free air. Scattered diverticula are noted descending colon. No evidence of acute diverticulitis. Multiple sigmoid colon diverticula. Again noted significant enlarged prostate gland with polypoid indentation of urinary bladder base. Prostate gland measures at least 6.4 x 7.7 cm.  Atherosclerotic calcifications  of coronary arteries. No destructive bony lesions are noted within pelvis. No inguinal adenopathy.  There is no pericecal inflammation.  Mild distended urinary bladder.  IMPRESSION: 1. There is a large partially loculated left pleural effusion. Stable left pleural lesion. Again noted pericardial effusion. 2. No acute inflammatory process within abdomen or pelvis. 3. Status postcholecystectomy. Again noted fatty replacement of the pancreas. 4. No hydronephrosis or hydroureter.  Stable left renal cysts. 5. Again noted significant enlarged prostate gland with polypoid indentation of urinary bladder base. 6. Osteopenia and degenerative changes thoracolumbar spine. 7. Distal colonic diverticula without evidence of acute diverticulitis.   Electronically Signed   By: Lahoma Crocker [name]   On: 08/18/2013 10:59    EKG: Independently reviewed. As above, sinus tachycardia with a heart rate of 120 beats per minute.  Assessment/Plan Principal Problem:   Nausea and/or vomiting Active Problems:   Abdominal pain, generalized   Pleural effusion, malignant   Loculated pleural effusion   Arteriosclerotic cardiovascular disease (ASCVD)   Multiple Skin cancer   Squamous cell carcinoma   Lung cancer   Tachyarrhythmia   Cardiomyopathy   Hx of malignant melanoma    Hypokalemia   HTN (hypertension)   1. Generalized abdominal pain nausea and vomiting. The patient's vomiting has resolved. Most if not all of his GI symptomatology has resolved and he requested a regular diet. He does have fatty infiltration of his pancreas and he may have underlying pancreatic insufficiency. CT of his abdomen revealed no acute intra-abdominal or intrapelvic findings. Will need to assess for urinary tract infection. 2. Large loculated left pleural effusion, likely parapneumonic and malignant. The patient had a thoracentesis on 07/04/2013 which yielded 1.2 L. The cytology revealed no malignant cells. He was seen by cardiothoracic surgeon Dr. Nils Pyle who mentioned that the patient may need a Pleurx catheter or possibly a pericardial window but he was not a candidate for VATS with talc pleurodesis. I recommended a transfer to [facility] for admission for this procedure, but the patient refused; he wanted to stay at [facility]. He is receptive to another thoracentesis. 3. Recurrent lung cancer. The patient also has a history of malignant melanoma of the chest, status post excision and status post excision of a squamous cell carcinoma from his left ear. He has received chemotherapy and radiation therapy for treatment of lung cancer in the past. The patient had a left pleural lung biopsy on 07/30/2013. The surgical cytology revealed metastatic high-grade carcinoma. The patient has a history of non-small cell lung cancer. He is now being seen by oncologist, Dr. Tressie Stalker. Overall, the patient appears to have a poor prognosis. 4. Possible pneumonia with parapneumonic and/or malignant effusion. 5. Tachyarrhythmia. The patient has a history of both nonsustained ventricle tachycardia and paroxysmal atrial flutter. His EKG currently reveals sinus tachycardia. He is on metoprolol chronically. Apparently Coumadin was discontinued. 6. Cardiomyopathy, likely ischemic/coronary artery  disease. 2-D echocardiogram November 2014 revealed an ejection fraction of 20-25%. Prior to that, his ejection fraction was 30% in 2012. As stated above, he is treated with metoprolol and aspirin chronically. ACE inhibitor is not on his medication list. This will be added. 7. Hypertension. As above. 8. Hypokalemia. Will supplement/replete.      Plan: 1. Start Levaquin and vancomycin empirically. 2. Will ask IR to perform a thoracentesis on 08/20/2013. 3. We'll consult general surgeon, Dr. Romona Curls for Port-A-Cath that was to be scheduled next week. 4. The patient received approximately 2 L of IV fluids in  the emergency department. Will continue gentle IV fluids with caution given his low ejection fraction. 5. Will change beta blockade therapy to Coreg. We'll add lisinopril. We'll hold aspirin due to anticipated thoracentesis. 6. We'll give a trial of pancreatic enzymes. The patient requested a regular diet. 7. We'll give him 1 g of magnesium sulfate intravenously as his magnesium level is borderline low. 8. We'll order MRI of the brain on 08/20/2013 as this was going to be scheduled next week by oncology.    Code Status: Limited code. The patient does not want to be intubated, but wants all the other modalities. Family Communication: Discussed with his daughter-in-law Hulan Amato and his close friend.  Time spent: One hour and 10 minutes.  [facility] Pager [phone]

## 2013-08-18 NOTE — Progress Notes (Signed)
  CARE MANAGEMENT ED NOTE 08/18/2013  Patient:  Stephen Martin   Account Number:  [id number]  Date Initiated:  08/18/2013  Documentation initiated by:  Joan Flores  Subjective/Objective Assessment:     Subjective/Objective Assessment Detail:   79 male came into ED with complain of  N&V, abd pain, SOB. Admitted inpatient.  Patient has hx. of lung cancer and other comorbidities.     Action/Plan:   Action/Plan Detail:   Anticipated DC Date:  08/21/2013     Status Recommendation to Physician:   Result of Recommendation:      DC Planning Services  CM consult    Choice offered to / List presented to:            Status of service:  Completed, signed off  ED Comments:   ED Comments Detail:

## 2013-08-18 NOTE — ED Provider Notes (Signed)
CSN: [id number]     Arrival date & time 08/18/13  0746 History   First MD Initiated Contact with Patient 08/18/13 0757     Chief Complaint  Patient presents with  . Abdominal Pain  . Nausea   (Consider location/radiation/quality/duration/timing/severity/associated sxs/prior Treatment) HPI Comments: Patient is 80 year old patient of Dr. 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  He also has a history of melanoma.  He has been being treated for chronic nausea and his medication was changed this week to compazine.  His daughter-in-law reports no improvement in the nausea or his appetite.  States that he just started complaining of episodic abdominal pain and the nausea with vomiting yesterday.  He reports no blood or bile in the vomit.  He reports no return of appetite and has lost about 40 pounds in the last several months.  He is due to start chemotherapy in several days and will see Dr. Romona Curls for portacath placement on Thursday.  He reports no pain currently.  Patient is a 80 y.o. male presenting with abdominal pain. The history is provided by the patient, a relative, a caregiver and medical records. No language interpreter was used.  Abdominal Pain Pain location:  Periumbilical Pain quality: sharp and shooting   Pain radiates to:  Chest Pain severity:  Moderate Onset quality:  Gradual Duration:  4 weeks Timing:  Intermittent Progression:  Worsening Chronicity:  New Context: recent illness   Context: not diet changes, not eating, not previous surgeries, not recent travel, not sick contacts, not suspicious food intake and not trauma   Relieved by:  Nothing Worsened by:  Nothing tried Ineffective treatments:  None tried Associated symptoms: anorexia, chest pain, cough, fatigue, nausea, shortness of breath and vomiting   Associated symptoms: no belching, no constipation, no diarrhea, no dysuria, no fever, no  hematemesis, no hematochezia and no melena   Risk factors: being elderly     Past Medical History  Diagnosis Date  . Hypertension   . Arteriosclerotic cardiovascular disease (ASCVD)     Echocardiogram in 2008: Mild LVH with normal EF; cardiac cath in 09/2005-nonobstructive coronary disease with 50% LAD  . Nonsustained ventricular tachycardia   . Carotid bruit     Right; Carotid ultrasound in 08/2006: scattered atherosclerotic plaque without significant focal disease  . Paroxysmal atrial flutter     Apical left ventricular thrombus and 01/2011  . Chronic anticoagulation   . Abdominal wall hematoma     Admitted 01/2011; supratherapeutic INR  . Cardiomyopathy     EF of 35% in 01/2011  . Upper GI bleed   . BPH (benign prostatic hypertrophy) 06/14/2011  . Hx of radiation therapy 03/04/04- 04/22/04    left parotid bed, upper jugular chain 45 Gy, 25 fx, parotid bed boosted 63 Gy 9 fx  . Squamous cell carcinoma 2005    left face, left parotid gland  . History of radiation therapy 09/16/05- 11/05/05    central mediastinum, chest  . Carcinoma, lung 2007    Left upper lobe; [place], stage III; treated with RT and chemotherapy  . Multiple Skin cancer 06/14/2011  . PUD (peptic ulcer disease)   . Melanoma     chest  . 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 Atrial fibrillation    Past Surgical History  Procedure Laterality Date  . Inguinal hernia repair    . Cholecystectomy    .  Laparoscopic gastrotomy w/ repair of ulcer    . Skin lesion    . Skin biopsy    . Mohs surgery  05/01/2013    melanoma of chest  . 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 Skin graft Left 11/2003    left face  . Neck dissection Left     resection of squamous cell carcinoma-skin  . Lung biopsy     Family History  Problem Relation Age of Onset  . Stroke Mother   . Hypertension Mother   . Alzheimer's disease Father   . Skin cancer Sister   . Cancer Daughter    History  Substance Use  Topics  . Smoking status: Former Smoker    Types: Cigarettes  . Smokeless tobacco: Former Systems developer    Quit date: 05/29/1953  . Alcohol Use: No    Review of Systems  Constitutional: Positive for fatigue. Negative for fever.  Respiratory: Positive for cough and shortness of breath.   Cardiovascular: Positive for chest pain.  Gastrointestinal: Positive for nausea, vomiting, abdominal pain and anorexia. Negative for diarrhea, constipation, melena, hematochezia and hematemesis.  Genitourinary: Negative for dysuria.  All other systems reviewed and are negative.    Allergies  Amoxil and Cefzil  Home Medications   Current Outpatient Rx  Name  Route  Sig  Dispense  Refill  . acetaminophen (TYLENOL) 500 MG tablet   Oral   Take 500 mg by mouth every 6 (six) hours as needed for mild pain.          Marland Kitchen azithromycin (ZITHROMAX Z-PAK) 250 MG tablet      Take 2 tablets (500 mg) on  Day 1,  followed by 1 tablet (250 mg) once daily on Days 2 through 5.   6 each   0   . metoprolol tartrate (LOPRESSOR) 25 MG tablet               . oxybutynin (DITROPAN) 5 MG tablet   Oral   Take 1 tablet (5 mg total) by mouth daily.   30 tablet   6    BP 151/94  Pulse 149  Temp(Src) 97.5 F (36.4 C) (Oral)  Resp 25  SpO2 97% Physical Exam  Nursing note and vitals reviewed. Constitutional: He is oriented to person, place, and time. He appears well-developed. No distress.  cachetic  HENT:  Head: Atraumatic.  Nose: Nose normal.  Mouth/Throat: No oropharyngeal exudate.  Multiple scars on face from melanoma removal, pinna of left ear surgically absent, dry mucous membranes  Eyes: Conjunctivae are normal. No scleral icterus.  Neck: Normal range of motion. Neck supple.  Cardiovascular: Exam reveals no gallop and no friction rub.   Murmur heard. Tachycardia, irregular rhythm with extra systoles, 3/5 flow murmur noted at 2nd ICS right sternal area.  Pulmonary/Chest: Effort normal. No respiratory  distress. He has no wheezes. He has no rales.  Decreased breath sounds LLL  Abdominal: Soft. Bowel sounds are normal. He exhibits no distension and no mass. There is no tenderness. There is no rebound and no guarding.  Musculoskeletal: Normal range of motion. He exhibits no edema and no tenderness.  Lymphadenopathy:    He has no cervical adenopathy.  Neurological: He is alert and oriented to person, place, and time. He exhibits normal muscle tone. Coordination normal.  Skin: Skin is warm and dry. No rash noted. No erythema. There is pallor.  Psychiatric: He has a normal mood and affect. His behavior is  normal. Judgment and thought content normal.    ED Course  Procedures (including critical care time) Labs Review Labs Reviewed  CBC WITH DIFFERENTIAL  COMPREHENSIVE METABOLIC PANEL  LIPASE, BLOOD  LACTIC ACID, PLASMA   Imaging Review No results found.  EKG Interpretation   None      Results for orders placed during the hospital encounter of 08/18/13  CBC WITH DIFFERENTIAL      Result Value Range   WBC 11.4 (*) 4.0 - 10.5 K/uL   RBC 4.67  4.22 - 5.81 MIL/uL   Hemoglobin 11.9 (*) 13.0 - 17.0 g/dL   HCT 39.0  39.0 - 52.0 %   MCV 83.5  78.0 - 100.0 fL   MCH 25.5 (*) 26.0 - 34.0 pg   MCHC 30.5  30.0 - 36.0 g/dL   RDW 16.0 (*) 11.5 - 15.5 %   Platelets 283  150 - 400 K/uL   Neutrophils Relative % 70  43 - 77 %   Neutro Abs 8.0 (*) 1.7 - 7.7 K/uL   Lymphocytes Relative 12  12 - 46 %   Lymphs Abs 1.4  0.7 - 4.0 K/uL   Monocytes Relative 5  3 - 12 %   Monocytes Absolute 0.6  0.1 - 1.0 K/uL   Eosinophils Relative 13 (*) 0 - 5 %   Eosinophils Absolute 1.5 (*) 0.0 - 0.7 K/uL   Basophils Relative 0  0 - 1 %   Basophils Absolute 0.0  0.0 - 0.1 K/uL  COMPREHENSIVE METABOLIC PANEL      Result Value Range   Sodium 136 (*) 137 - 147 mEq/L   Potassium 3.5 (*) 3.7 - 5.3 mEq/L   Chloride 96  96 - 112 mEq/L   CO2 28  19 - 32 mEq/L   Glucose, Bld 95  70 - 99 mg/dL   BUN 13  6 - 23  mg/dL   Creatinine, Ser 0.88  0.50 - 1.35 mg/dL   Calcium 8.6  8.4 - 10.5 mg/dL   Total Protein 8.3  6.0 - 8.3 g/dL   Albumin 2.7 (*) 3.5 - 5.2 g/dL   AST 18  0 - 37 U/L   ALT 6  0 - 53 U/L   Alkaline Phosphatase 104  39 - 117 U/L   Total Bilirubin 0.5  0.3 - 1.2 mg/dL   GFR calc non Af Amer 79 (*) >90 mL/min   GFR calc Af Amer >90  >90 mL/min  LIPASE, BLOOD      Result Value Range   Lipase 10 (*) 11 - 59 U/L  LACTIC ACID, PLASMA      Result Value Range   Lactic Acid, Venous 1.5  0.5 - 2.2 mmol/L   Dg Chest 2 View  08/18/2013   CLINICAL DATA:  Lung cancer and melanoma history, weakness, abdominal pain  EXAM: CHEST  2 VIEW  COMPARISON:  08/18/2013, 07/10/2013  FINDINGS: Moderately large left effusion noted, better demonstrated on today's CT of the abdomen. Increased patchy bibasilar airspace process in both lower lobes and left upper lobe. Chronic scarring left upper lobe as well. No pneumothorax. Findings concerning for superimposed pneumonia by plain radiography.  IMPRESSION: Moderately large left effusion better demonstrated by CT  Patchy bilateral airspace process concerning for pneumonia.  Chronic left upper lobe scarring   Electronically Signed   By: Daryll Brod [name]   On: 08/18/2013 10:57   Ct Abdomen Pelvis W Contrast  08/18/2013   CLINICAL DATA:  Abdominal pain, nausea,  history of lung cancer, skin cancer  EXAM: CT ABDOMEN AND PELVIS WITH CONTRAST  TECHNIQUE: Multidetector CT imaging of the abdomen and pelvis was performed using the standard protocol following bolus administration of intravenous contrast.  CONTRAST:  65mL OMNIPAQUE IOHEXOL 300 MG/ML SOLN, 134mL OMNIPAQUE IOHEXOL 300 MG/ML SOLN  COMPARISON:  07/09/2013  FINDINGS: Sagittal images of the spine shows diffuse osteopenia. Degenerative changes thoracolumbar spine. Again noted loculated large left pleural effusion. Again noted left pleural lesion posteriorly measures about 2.7 cm without significant change from prior exam.  Small pericardial effusion. Bilateral lower lobe atelectasis or scarring.  The patient is status post cholecystectomy. No focal hepatic mass. Fatty replaced pancreas is noted. The spleen and adrenal glands are unremarkable.  Kidneys are symmetrical in size and enhancement. Left renal cysts are stable from prior exam. Stable minimal distension of left ureter.  Atherosclerotic calcifications of abdominal aorta and iliac arteries.  Delayed renal images shows bilateral renal symmetrical excretion.  No small bowel obstruction. No ascites or free air. Scattered diverticula are noted descending colon. No evidence of acute diverticulitis. Multiple sigmoid colon diverticula. Again noted significant enlarged prostate gland with polypoid indentation of urinary bladder base. Prostate gland measures at least 6.4 x 7.7 cm.  Atherosclerotic calcifications of coronary arteries. No destructive bony lesions are noted within pelvis. No inguinal adenopathy.  There is no pericecal inflammation.  Mild distended urinary bladder.  IMPRESSION: 1. There is a large partially loculated left pleural effusion. Stable left pleural lesion. Again noted pericardial effusion. 2. No acute inflammatory process within abdomen or pelvis. 3. Status postcholecystectomy. Again noted fatty replacement of the pancreas. 4. No hydronephrosis or hydroureter.  Stable left renal cysts. 5. Again noted significant enlarged prostate gland with polypoid indentation of urinary bladder base. 6. Osteopenia and degenerative changes thoracolumbar spine. 7. Distal colonic diverticula without evidence of acute diverticulitis.   Electronically Signed   By: Lahoma Crocker [name]   On: 08/18/2013 10:59   Nm Pet Image Restag (ps) Skull Base To Thigh  07/19/2013   CLINICAL DATA:  Subsequent treatment strategy for left lower lobe lung cancer. Also history of melanoma. Status post radiation therapy to the neck and chest.  EXAM: NUCLEAR MEDICINE PET SKULL BASE TO THIGH  FASTING BLOOD  GLUCOSE:  Value: 92mg /dl  TECHNIQUE: 18.4 mCi F-18 FDG was injected intravenously. CT data was obtained and used for attenuation correction and anatomic localization only. (This was not acquired as a diagnostic CT examination.) Additional exam technical data entered on technologist worksheet.  COMPARISON:  Chest CT 06/25/2013, PET-CT most recently 09/08/2005.  FINDINGS: NECK  No hypermetabolic lymph nodes in the neck.  CHEST  There is a large partly loculated left pleural effusion. Associated FDG avid pleural nodularity is identified, including a dominant left upper lobe pleural parenchymal nodule, SUV maximum 20.2.  Dominant FDG avid left upper lobe pleural parenchymal nodule demonstrates SUV maximal uptake 21.8.  Left lower lobe pleural parenchymal nodule demonstrates increased FDG avidity, SUV maximum 28.6. There is also FDG avid pleural parenchymal nodularity elsewhere within the left hemi thorax.  Right perihilar consolidation is identified without internal abnormal FDG uptake.  Marked prostatomegaly noted without abnormal internal FDG uptake.  ABDOMEN/PELVIS  No abnormal hypermetabolic activity within the liver, pancreas, adrenal glands, or spleen. No hypermetabolic lymph nodes in the abdomen or pelvis.  SKELETON  No focal hypermetabolic activity to suggest skeletal metastasis.  IMPRESSION: Malignant range FDG uptake within multiple left hemithoracic pleural parenchymal masses compatible with disease recurrence and intrathoracic  metastasis.  No abnormal FDG uptake within the neck, abdomen, or pelvis to suggest malignancy.   Electronically Signed   By: Conchita Paris [name]   On: 07/19/2013 16:38   Ct Biopsy  07/30/2013   CLINICAL DATA:  History of lung carcinoma and melanoma. New hypermetabolic left pleural lesions on PET-CT.  EXAM: CT GUIDED CORE BIOPSY OF LEFT PLEURAL MASS  ANESTHESIA/SEDATION: Intravenous Fentanyl and Versed were administered as conscious sedation during continuous cardiorespiratory  monitoring by the radiology RN, with a total moderate sedation time of 16 minutes.  PROCEDURE: The procedure risks, benefits, and alternatives were explained to the patient. Questions regarding the procedure were encouraged and answered. The patient understands and consents to the procedure.  The left posterior chest was prepped with Betadinein a sterile fashion, and a sterile drape was applied covering the operative field. A sterile gown and sterile gloves were used for the procedure. Local anesthesia was provided with 1% Lidocaine.  Under CT fluoroscopic guidance, a 17 gauge trocar needle was advanced to the margin of the lesion. Once needle tip position was confirmed, coaxial 18-gauge core biopsy samples were obtained, submitted in formalin to surgical pathology. The guide needle was removed. Postprocedure images show no significant pneumothorax or hemorrhage.  Complications:   None  FINDINGS: Moderate left pleural effusion and hyperdense pleural mass again noted. Biopsy as above.  IMPRESSION: 1. Technically successful CT-guided core biopsy of left pleural mass.   Electronically Signed   By: Arne Cleveland [name]   On: 07/30/2013 10:12      MDM  Left pleural effusion Chronic nausea Episodic abdominal pain Failure to thrive  Patient with history of lung ca and melanoma presents with several month history of worsening nausea, episodic abdominal pain and weight loss.  He has recently had thoracentesis but left some amount of fluid in there.  He denies any shortness of breath or chest pain at this time.  I have spoken with the family and they wish for the patient to stay here at [facility], I have also spoken with radiology who reports that Dr. Posey Pronto will be here for IR on Monday and that he can do a thoracentesis, Dr. Roderic Palau has seen the patient and agrees that the patient is clinically stable enough to stay here and [facility] will admit.  Have spoken with Dr. Caryn Section who will admit.    Idalia Needle Joelyn Oms,  PA-C 08/18/13 1158

## 2013-08-18 NOTE — ED Notes (Signed)
Patient states this morning he did not feel well and he felt weakness all over. He went to the bathroom and nothing seemed to get better so "he had to do something about it." Patient complains of lower abdominal pain that has subsided upon arrival. The patient states he experiences pressure upon palpation, but no current pain. Patient has a history of lung cancer and is currently seeing Dr. Romona Curls. Patient is in no acute distress.

## 2013-08-18 NOTE — ED Notes (Signed)
Pt c/o feeling nauseated during the night and having upper abd pain that radiates into chest.  PT also reports generalized weakness and difficulty walking.  Pt saw Dr. Romona Curls last week for eval for portacath.  Reports pt has lung cancer and is supposed to start chemo soon.  Pt had thoracentesis recently and had approx 1 liter of fluid drained of of his left lung.  Family says pt has been having nausea and unable to eat since Thanksgiving.

## 2013-08-18 NOTE — ED Provider Notes (Signed)
Medical screening examination/treatment/procedure(s) were performed by non-physician practitioner and as supervising physician I was immediately available for consultation/collaboration.  EKG Interpretation    Date/Time:    Ventricular Rate:    PR Interval:    QRS Duration:   QT Interval:    QTC Calculation:   R Axis:     Text Interpretation:                Maudry Diego, MD 08/18/13 [phone]

## 2013-08-18 NOTE — Progress Notes (Signed)
ANTIBIOTIC CONSULT NOTE - INITIAL  Pharmacy Consult for Vancomycin Indication: pneumonia  Allergies  Allergen Reactions  . Amoxil [Amoxicillin]     RASH  . Cefzil [Cefprozil]     Patient Measurements:     Vital Signs: Temp: 97.2 F (36.2 C) (01/10 1359) Temp src: Axillary (01/10 1359) BP: 146/90 mmHg (01/10 1359) Pulse Rate: 139 (01/10 1359) Intake/Output from previous day:   Intake/Output from this shift: Total I/O In: -  Out: 200 [Urine:200]  Labs:  Recent Labs  08/18/13 0805  WBC 11.4*  HGB 11.9*  PLT 283  CREATININE 0.88   The CrCl is unknown because both a height and weight (above a minimum accepted value) are required for this calculation. No results found for this basename: VANCOTROUGH, VANCOPEAK, VANCORANDOM, GENTTROUGH, GENTPEAK, GENTRANDOM, TOBRATROUGH, TOBRAPEAK, TOBRARND, AMIKACINPEAK, AMIKACINTROU, AMIKACIN,  in the last 72 hours   Microbiology: No results found for this or any previous visit (from the past 720 hour(s)).  Medical History: Past Medical History  Diagnosis Date  . Hypertension   . Arteriosclerotic cardiovascular disease (ASCVD)     Echocardiogram in 2008: Mild LVH with normal EF; cardiac cath in 09/2005-nonobstructive coronary disease with 50% LAD  . Nonsustained ventricular tachycardia   . Carotid bruit     Right; Carotid ultrasound in 08/2006: scattered atherosclerotic plaque without significant focal disease  . Paroxysmal atrial flutter     Apical left ventricular thrombus and 01/2011  . Chronic anticoagulation   . Abdominal wall hematoma     Admitted 01/2011; supratherapeutic INR  . Cardiomyopathy     EF of 20-25 % 06/2013 and 30% in 01/2011  . Upper GI bleed   . BPH (benign prostatic hypertrophy) 06/14/2011  . Hx of radiation therapy 03/04/04- 04/22/04    left parotid bed, upper jugular chain 45 Gy, 25 fx, parotid bed boosted 63 Gy 9 fx  . Squamous cell carcinoma 2005    left face, left parotid gland  . History of radiation  therapy 09/16/05- 11/05/05    central mediastinum, chest  . Carcinoma, lung 2007    Left upper lobe; [place], stage III; treated with RT and chemotherapy  . Multiple Skin cancer 06/14/2011  . PUD (peptic ulcer disease)   . Melanoma     chest  . 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 Atrial fibrillation   . Non-small cell lung cancer     Left upper lobe; treated with concomitant radiation and chemotherapy, then post radiation chemotherapy finishing all as of 04/25/2006 without recurrence.     Medications:  Prescriptions prior to admission  Medication Sig Dispense Refill  . acetaminophen (TYLENOL) 500 MG tablet Take 500 mg by mouth every 6 (six) hours as needed for mild pain.       Marland Kitchen aspirin EC 81 MG tablet Take 81 mg by mouth daily.      . ondansetron (ZOFRAN) 4 MG tablet Take 1 tablet by mouth daily as needed for nausea.       Marland Kitchen oxybutynin (DITROPAN) 5 MG tablet Take 1 tablet (5 mg total) by mouth daily.  30 tablet  6  . ALPRAZolam (XANAX) 0.5 MG tablet Take 0.5 mg by mouth 2 (two) times daily as needed for anxiety.        Assessment: Okay for Protocol, CrCL estimated at 39ml/min using previous Height and Weight.  Goal of Therapy:  Vancomycin trough level 15-20 mcg/ml  Plan:  Vancomycin 1500mg  IV x 1 then 1000mg  IV every  12 hours. Measure antibiotic drug levels at steady state Follow up culture results  Pricilla Larsson 08/18/2013,2:41 PM

## 2013-08-19 ENCOUNTER — Encounter (HOSPITAL_COMMUNITY): Payer: Self-pay | Admitting: Internal Medicine

## 2013-08-19 DIAGNOSIS — K8689 Other specified diseases of pancreas: Secondary | ICD-10-CM

## 2013-08-19 DIAGNOSIS — J189 Pneumonia, unspecified organism: Secondary | ICD-10-CM

## 2013-08-19 HISTORY — DX: Other specified diseases of pancreas: K86.89

## 2013-08-19 LAB — CBC
HEMATOCRIT: 40.3 % (ref 39.0–52.0)
HEMOGLOBIN: 12.5 g/dL — AB (ref 13.0–17.0)
MCH: 25.8 pg — AB (ref 26.0–34.0)
MCHC: 31 g/dL (ref 30.0–36.0)
MCV: 83.3 fL (ref 78.0–100.0)
Platelets: 272 10*3/uL (ref 150–400)
RBC: 4.84 MIL/uL (ref 4.22–5.81)
RDW: 16.2 % — ABNORMAL HIGH (ref 11.5–15.5)
WBC: 12.2 10*3/uL — ABNORMAL HIGH (ref 4.0–10.5)

## 2013-08-19 LAB — COMPREHENSIVE METABOLIC PANEL
ALT: 7 U/L (ref 0–53)
AST: 14 U/L (ref 0–37)
Albumin: 2.4 g/dL — ABNORMAL LOW (ref 3.5–5.2)
Alkaline Phosphatase: 103 U/L (ref 39–117)
BUN: 10 mg/dL (ref 6–23)
CALCIUM: 8.5 mg/dL (ref 8.4–10.5)
CO2: 28 mEq/L (ref 19–32)
Chloride: 99 mEq/L (ref 96–112)
Creatinine, Ser: 0.84 mg/dL (ref 0.50–1.35)
GFR calc Af Amer: >90 mL/min (ref >90)
GFR calc non Af Amer: 81 mL/min — ABNORMAL LOW (ref >90)
Glucose, Bld: 91 mg/dL (ref 70–99)
Potassium: 4.3 mEq/L (ref 3.7–5.3)
SODIUM: 136 meq/L — AB (ref 137–147)
TOTAL PROTEIN: 7.7 g/dL (ref 6.0–8.3)
Total Bilirubin: 0.7 mg/dL (ref 0.3–1.2)

## 2013-08-19 MED ORDER — LORAZEPAM 0.5 MG PO TABS: 0.5000 mg | ORAL_TABLET | Freq: Two times a day (BID) | ORAL | Status: DC | PRN

## 2013-08-19 MED ORDER — LISINOPRIL 5 MG PO TABS: 5.0000 mg | ORAL_TABLET | Freq: Every day | ORAL | Status: DC

## 2013-08-19 MED ORDER — PANCRELIPASE (LIP-PROT-AMYL) 12000-38000 UNITS PO CPEP: 1.0000 | ORAL_CAPSULE | Freq: Three times a day (TID) | ORAL | Status: DC

## 2013-08-19 MED ORDER — LEVOFLOXACIN 750 MG PO TABS: 750.0000 mg | ORAL_TABLET | Freq: Every day | ORAL | Status: DC

## 2013-08-19 MED ORDER — PANTOPRAZOLE SODIUM 40 MG PO TBEC: 40.0000 mg | DELAYED_RELEASE_TABLET | Freq: Every day | ORAL | Status: DC

## 2013-08-19 MED ORDER — LORAZEPAM 0.5 MG PO TABS: 0.5000 mg | ORAL_TABLET | Freq: Every evening | ORAL | Status: DC | PRN

## 2013-08-19 MED ORDER — CARVEDILOL 6.25 MG PO TABS: 6.2500 mg | ORAL_TABLET | Freq: Two times a day (BID) | ORAL | Status: DC

## 2013-08-19 NOTE — Discharge Summary (Signed)
Physician Discharge Summary  Stephen Martin DGL:[id number] DOB: 1930-08-23 DOA: 08/18/2013  PCP: Stephen Lange, MD  Admit date: 08/18/2013 Discharge date: 08/19/2013  Time spent: Greater than 30 minutes  Recommendations for Outpatient Follow-up:  1. The patient was instructed to followup with Dr. Prescott Martin, Dr. Tressie Martin, and Dr. Wolfgang Martin as scheduled. This was discussed with nursing administrator and his daughter-in-law, Stephen Martin.  Discharge Diagnoses:   1. Generalized abdominal pain with nausea and limited vomiting. Resolved with supportive treatment. Etiology possibly secondary to pleural effusion or pancreatic insufficiency. 2. Suspect pancreatic insufficiency. 3. Large loculated left pleural effusion with associated pneumonia. The patient refused transfer to [facility] for a chest tube. He will followup with Dr. Prescott Martin in the outpatient setting for a possible Pleurx catheter. 4. Malignant pleural effusion. 5. Recurrent lung cancer. Recent left pleural biopsy 07/2013 revealed metastatic high-grade carcinoma. The patient has a history of treated non-small cell lung cancer. 6. History of malignant melanoma of the chest wall, status post excision at [facility]. 7. 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 8. Arteriosclerotic cardiovascular disease. 9. Cardiomyopathy likely ischemic. Recent ejection fraction in November 2014 was 20-25%. 10. Tachyarrhythmia, consistent with sinus tachycardia. The patient has a history of paroxysmal atrial fibrillation and nonsustained ventricular tachycardia, none apparent during this hospitalization. 11. Hypokalemia. 12. Hypertension. 13. Overall very poor prognosis.  Discharge Condition: Improved.  Diet recommendation: Heart healthy as tolerated.  Filed Weights   08/18/13 1517 08/19/13 0535  Weight: 75.2 kg (165 lb 12.6 oz) 75.3 kg (166 lb 0.1 oz)    History of present illness:    Stephen Martin is a 80 y.o. male with a history of multiple malignancies including non-small cell lung cancer, malignant melanoma, and multiple squamous cell cutaneous cancers, CAD and cardiomyopathy, who presented to the emergency department with a complaint of abdominal pain, nausea, occasional vomiting, and shortness of breath. The patient has had these symptoms intermittently for the past 2 weeks. He developed shortness of breath which concerned him. His shortness of breath occurred when he was lying flat and when he tried to increase his activity, not at rest. He had a cough which has been nonproductive. He denied chest pain or pleurisy. He did feel his heart race. He denied subjective fever or chills. He denied body aches. Subsequently, he developed abdominal pain that started in the epigastrium and radiated to below the umbilicus. This was associated with nausea and a couple episodes of vomiting. He denied coffee grounds emesis. His last bowel movement was yesterday and was considered normal. He denied constipation or diarrhea. He denied black tarry stools or bright red blood per rectum.  In the emergency department, he was afebrile and tachycardic with a heart rate from 85-140 beats per minute. His EKG revealed sinus tachycardia with a heart rate of 120 beats per minute. His blood pressure was within normal limits. His chest x-ray revealed moderately large left effusion and patchy bilateral air space process concerning for pneumonia and chronic left upper lobe scarring. CT of his abdomen and pelvis revealed a large partially loculated left pleural effusion; status post cholecystectomy; fatty replacement of the pancreas; enlarged prostate; and diverticula without evidence of diverticulitis. His lab data were significant for a potassium of 3.5, low lipase of 10 and a CBC of 11.4. He was admitted for further evaluation and management.    Hospital Course:  I advised that the patient be transferred for  a chest  tube insertion or consultation with  Dr. Prescott Martin for insertion of Pleurx. This was discussed with Ms. Stephen Martin, nursing supervisor and the patient's daughter-in-law. The patient refused and wanted to stay at [facility]. He was started on Levaquin and vancomycin empirically for what was to be pneumonia associated with a loculated pleural effusion. Also, the pleural effusion was likely malignant in origin. He did have a prior thoracentesis on 07/04/2013 yielding 1.2 L. Dr. Prescott Martin had anticipated that the patient would need a Pleurx catheter in the near future. The patient preferred having this done as an outpatient as he improved clinically and symptomatically in the ED. He did not appear to be toxic-appearing nor did he appear to be in any respiratory distress. Surprisingly, he was afebrile and his white blood cell count was marginally elevated. He was given approximately 2 L of IV fluids in the emergency department. Because of his cardiomyopathy and low ejection fraction, he was continued on gentle IV fluids overnight at 50 cc an hour. For treatment of his cardiomyopathy and hypertension as well as tachycardia arrhythmia, metoprolol was discontinued in favor of carvedilol. Lisinopril was added due to his severe cardiomyopathy. Potassium chloride was repleted/supplemented orally and gently and IV fluids. His magnesium level was assessed and was found to be low. He was supplemented with 1 g of magnesium sulfate intravenously. When I first examined and evaluated the patient, he had no complaints of abdominal pain, nausea, or vomiting. He wanted his diet advanced and changed from clear liquids to a regular diet. This was done. He tolerated a regular diet without abdominal pain nausea vomiting. The CT of his abdomen revealed fatty infiltration of his pancreas. His symptomatology may have been secondary to pancreatic insufficiency. Therefore, Pancrease was added to his overall therapy.  The plan was  to keep the patient over the weekend with anticipation that he would undergo a possible thoracentesis on 08/20/2013. However, the patient desired to go home and under the circumstances, it appeared to be reasonable as he was hemodynamically stable, afebrile, and asymptomatic. His tachyarrhythmia resolved. He is scheduled to see Dr. Romona Curls for a Port-A-Cath insertion as ordered by oncologist Dr. Tressie Martin. It is uncertain when he was supposed to followup with Dr. Prescott Martin. However, the patient and his close friend as well as Ms. Stephen Martin will make sure that his office is notified so that the patient can be seen, hopefully this week for potential insertion of the Pleurx. The patient is also due for an outpatient MRI which was anticipated to be ordered tomorrow as an inpatient, however, the patient wanted to go home and it will be done in [place] as scheduled on 08/21/2013.  The patient was discharged to home on 7 more days of Levaquin. As stated above, his cardiac medications were changed to carvedilol and lisinopril. He was also given a prescription for Pancrease to be taken with each meal.  Procedures:  None  Consultations:  None  Discharge Exam: Filed Vitals:   08/19/13 1410  BP: 130/75  Pulse: 74  Temp: 97.5 F (36.4 C)  Resp: 20    General: Pleasant alert 80 year old man sitting up in bed, in no acute distress. Cardiovascular: S1, S2, with a soft systolic murmur and no ectopy. Respiratory: Globally decreased breath sounds on the left and clear on the right. Breathing is nonlabored at rest.  Discharge Instructions  Discharge Orders   Future Appointments Provider Department Dept Phone   08/29/2013 2:00 PM Kathyrn Drown, MD [facility]  Medicine [phone]   09/19/2013 1:10 PM Kathyrn Drown, MD [place] [phone]   Future Orders Complete By Expires   Diet - low sodium heart healthy  As directed    Discharge instructions  As directed    Comments:     Do not  take metoprolol. Medications carvedilol and lisinopril are replacing metoprolol.  Followup as soon as possible with Dr. Nils Pyle for insertion of special chest tube. Followup with Dr. Tressie Martin as scheduled.   Increase activity slowly  As directed        Medication List         acetaminophen 500 MG tablet  Commonly known as:  TYLENOL  Take 500 mg by mouth every 6 (six) hours as needed for mild pain.     ALPRAZolam 0.5 MG tablet  Commonly known as:  XANAX  Take 0.5 mg by mouth 2 (two) times daily as needed for anxiety.     aspirin EC 81 MG tablet  Take 81 mg by mouth daily.     carvedilol 6.25 MG tablet  Commonly known as:  COREG  Take 1 tablet (6.25 mg total) by mouth 2 (two) times daily with a meal. New medication for your blood pressure and your heart.     levofloxacin 750 MG tablet  Commonly known as:  LEVAQUIN  Take 1 tablet (750 mg total) by mouth daily. Antibiotic to take for 7 more days.     lipase/protease/amylase 12000 UNITS Cpep capsule  Commonly known as:  CREON-12/PANCREASE  Take 1 capsule by mouth 3 (three) times daily with meals. Medication to help with food digestion.     lisinopril 5 MG tablet  Commonly known as:  PRINIVIL,ZESTRIL  Take 1 tablet (5 mg total) by mouth daily. New medication for your blood pressure and your heart.     LORazepam 0.5 MG tablet  Commonly known as:  ATIVAN  Take 1 tablet (0.5 mg total) by mouth at bedtime as needed for sleep.     ondansetron 4 MG tablet  Commonly known as:  ZOFRAN  Take 1 tablet by mouth daily as needed for nausea.     oxybutynin 5 MG tablet  Commonly known as:  DITROPAN  Take 1 tablet (5 mg total) by mouth daily.     pantoprazole 40 MG tablet  Commonly known as:  PROTONIX  Take 1 tablet (40 mg total) by mouth daily. Medication to treat gastric acid reflux.       Allergies  Allergen Reactions  . Amoxil [Amoxicillin]     RASH  . Cefzil [Cefprozil]        Follow-up Information   Follow up with  VAN Wilber Oliphant, MD In 1 week.   Specialty:  Cardiothoracic Surgery   Contact information:   [address] [place] [address] [phone]       Follow up with Stephen Lange, MD In 3 days.   Specialty:  Family Medicine   Contact information:   [address] [place] [address] [phone]        The results of significant diagnostics from this hospitalization (including imaging, microbiology, ancillary and laboratory) are listed below for reference.    Significant Diagnostic Studies: Dg Chest 2 View  08/18/2013   CLINICAL DATA:  Lung cancer and melanoma history, weakness, abdominal pain  EXAM: CHEST  2 VIEW  COMPARISON:  08/18/2013, 07/10/2013  FINDINGS: Moderately large left effusion noted, better demonstrated on today's CT of the abdomen.  Increased patchy bibasilar airspace process in both lower lobes and left upper lobe. Chronic scarring left upper lobe as well. No pneumothorax. Findings concerning for superimposed pneumonia by plain radiography.  IMPRESSION: Moderately large left effusion better demonstrated by CT  Patchy bilateral airspace process concerning for pneumonia.  Chronic left upper lobe scarring   Electronically Signed   By: Daryll Brod [name]   On: 08/18/2013 10:57   Ct Abdomen Pelvis W Contrast  08/18/2013   CLINICAL DATA:  Abdominal pain, nausea, history of lung cancer, skin cancer  EXAM: CT ABDOMEN AND PELVIS WITH CONTRAST  TECHNIQUE: Multidetector CT imaging of the abdomen and pelvis was performed using the standard protocol following bolus administration of intravenous contrast.  CONTRAST:  12mL OMNIPAQUE IOHEXOL 300 MG/ML SOLN, 158mL OMNIPAQUE IOHEXOL 300 MG/ML SOLN  COMPARISON:  07/09/2013  FINDINGS: Sagittal images of the spine shows diffuse osteopenia. Degenerative changes thoracolumbar spine. Again noted loculated large left pleural effusion. Again noted left pleural lesion posteriorly measures about 2.7 cm without significant  change from prior exam. Small pericardial effusion. Bilateral lower lobe atelectasis or scarring.  The patient is status post cholecystectomy. No focal hepatic mass. Fatty replaced pancreas is noted. The spleen and adrenal glands are unremarkable.  Kidneys are symmetrical in size and enhancement. Left renal cysts are stable from prior exam. Stable minimal distension of left ureter.  Atherosclerotic calcifications of abdominal aorta and iliac arteries.  Delayed renal images shows bilateral renal symmetrical excretion.  No small bowel obstruction. No ascites or free air. Scattered diverticula are noted descending colon. No evidence of acute diverticulitis. Multiple sigmoid colon diverticula. Again noted significant enlarged prostate gland with polypoid indentation of urinary bladder base. Prostate gland measures at least 6.4 x 7.7 cm.  Atherosclerotic calcifications of coronary arteries. No destructive bony lesions are noted within pelvis. No inguinal adenopathy.  There is no pericecal inflammation.  Mild distended urinary bladder.  IMPRESSION: 1. There is a large partially loculated left pleural effusion. Stable left pleural lesion. Again noted pericardial effusion. 2. No acute inflammatory process within abdomen or pelvis. 3. Status postcholecystectomy. Again noted fatty replacement of the pancreas. 4. No hydronephrosis or hydroureter.  Stable left renal cysts. 5. Again noted significant enlarged prostate gland with polypoid indentation of urinary bladder base. 6. Osteopenia and degenerative changes thoracolumbar spine. 7. Distal colonic diverticula without evidence of acute diverticulitis.   Electronically Signed   By: Lahoma Crocker [name]   On: 08/18/2013 10:59   Ct Biopsy  07/30/2013   CLINICAL DATA:  History of lung carcinoma and melanoma. New hypermetabolic left pleural lesions on PET-CT.  EXAM: CT GUIDED CORE BIOPSY OF LEFT PLEURAL MASS  ANESTHESIA/SEDATION: Intravenous Fentanyl and Versed were administered as  conscious sedation during continuous cardiorespiratory monitoring by the radiology RN, with a total moderate sedation time of 16 minutes.  PROCEDURE: The procedure risks, benefits, and alternatives were explained to the patient. Questions regarding the procedure were encouraged and answered. The patient understands and consents to the procedure.  The left posterior chest was prepped with Betadinein a sterile fashion, and a sterile drape was applied covering the operative field. A sterile gown and sterile gloves were used for the procedure. Local anesthesia was provided with 1% Lidocaine.  Under CT fluoroscopic guidance, a 17 gauge trocar needle was advanced to the margin of the lesion. Once needle tip position was confirmed, coaxial 18-gauge core biopsy samples were obtained, submitted in formalin to surgical pathology. The guide needle was removed. Postprocedure images show no  significant pneumothorax or hemorrhage.  Complications:   None  FINDINGS: Moderate left pleural effusion and hyperdense pleural mass again noted. Biopsy as above.  IMPRESSION: 1. Technically successful CT-guided core biopsy of left pleural mass.   Electronically Signed   By: Arne Cleveland [name]   On: 07/30/2013 10:12    Microbiology: No results found for this or any previous visit (from the past 240 hour(s)).   Labs: Basic Metabolic Panel:  Recent Labs Lab 08/18/13 0800 08/18/13 0805 08/19/13 0538  NA  --  136* 136*  K  --  3.5* 4.3  CL  --  96 99  CO2  --  28 28  GLUCOSE  --  95 91  BUN  --  13 10  CREATININE  --  0.88 0.84  CALCIUM  --  8.6 8.5  MG 1.6  --   --    Liver Function Tests:  Recent Labs Lab 08/18/13 0805 08/19/13 0538  AST 18 14  ALT 6 7  ALKPHOS 104 103  BILITOT 0.5 0.7  PROT 8.3 7.7  ALBUMIN 2.7* 2.4*    Recent Labs Lab 08/18/13 0805  LIPASE 10*   No results found for this basename: AMMONIA,  in the last 168 hours CBC:  Recent Labs Lab 08/18/13 0805 08/19/13 0538  WBC 11.4*  12.2*  NEUTROABS 8.0*  --   HGB 11.9* 12.5*  HCT 39.0 40.3  MCV 83.5 83.3  PLT 283 272   Cardiac Enzymes: No results found for this basename: CKTOTAL, CKMB, CKMBINDEX, TROPONINI,  in the last 168 hours BNP: BNP (last 3 results) No results found for this basename: PROBNP,  in the last 8760 hours CBG: No results found for this basename: GLUCAP,  in the last 168 hours     Signed:  Zoiey Christy  [facility] 08/19/2013, 4:49 PM

## 2013-08-21 ENCOUNTER — Encounter (HOSPITAL_COMMUNITY)
Admission: RE | Admit: 2013-08-21 | Discharge: 2013-08-21 | Disposition: A | Discharge status: Home / Self Care | Payer: Medicare Other | Source: Ambulatory Visit | Attending: General Surgery | Admitting: General Surgery

## 2013-08-22 NOTE — Consult Note (Signed)
Stephen Martin, Stephen Martin                ACCOUNT NO.:  [id number]  MEDICAL RECORD NO.:  [id number]  LOCATION:                                 FACILITY:  PHYSICIAN:  Felicie Morn, [name] DATE OF BIRTH:  11/20/1930  DATE OF CONSULTATION:  08/21/2013 DATE OF DISCHARGE:                                CONSULTATION   DIAGNOSIS:  Carcinoma of the left lung.  NOTE:  This is a 80 year old, white male who has decided to have further palliative chemotherapy.  This is against his family wishes.  However, he strongly desires this.  He will require a Port-A-Cath for the treatment of this.  Unfortunately, he had a Port-A-Cath that was removed, but now from his right subclavian and now he unfortunately needs another one placed.  He has been treated for his CA of the lung by radiation therapy as well.  PAST SURGERIES:  He has had multiple surgeries, TNA during childhood. He had a removal of kidney stones in the 1970, gallbladder surgery in the 1970s as well.  He had extensive surgery with a neck dissection done in 2005, for a left parotid gland tumor.  He has had recently in 2014, at [facility] a wide excision of a melanoma of his chest and he has had multiple skin cancers removed from his back and face and extremities. He has also had bilateral cataract surgeries done in 2002.  ALLERGIES:  He has no known drug allergies.  SOCIAL HISTORY:  He does not smoke or drink.  MEDICATIONS:  For medications, see medication list.  PHYSICAL EXAMINATION:  GENERAL:  He is 6 feet 1 inch, weighs 163-1/2 pounds, temperature is 97.5, pulse is 84, respirations 16, blood pressure 100/60. HEENT:  Head is normocephalic.  The patient has multiple areas of skin cancer in various stages of treatment.  He has had a left parotid surgery which is with a neck dissection.  He has had his left ear removed and has only the external auditory canal visible.  He has diminished hearing from this side.  SKIN:  Integuments in the  head and neck are rife with multiple skin cancers. CHEST:  Diminished breath sounds, particularly on the left side, with a rhonchi. HEART:  Regular rhythm. ABDOMEN:  Otherwise soft.  There are no hernias appreciated.  He had a catheter placed by Urology.  RECTAL:  Deferred. EXTREMITIES:  As stated with multiple skin cancers noted.  REVIEW OF SYSTEMS:  NEUROLOGICAL:  He has had no migraines or seizures, and no lateralizing findings.  His gait is somewhat unsteady. ENDOCRINE:  No history of diabetes or thyroid disease or adrenal problems.  CARDIOPULMONARY:  History of carcinoma of the left lung. MUSCULOSKELETAL:  Multiple skin cancers and history of melanoma of the chest.  GI:  No history of hepatitis, no problems apparently with constipation, diarrhea, or bright red rectal bleeding, or inflammatory bowel disease.  No unexplained weight loss.  He has never had a colonoscopy.  GU:  He has past history of kidney stones.  He has no frequency or dysuria at present.  REVIEW OF HISTORY AND PHYSICAL:  Stephen Martin is a 80 year old male who has an unfortunate diagnosis of metastatic  carcinoma of the lung with previous carcinoma of the parotid glands and multiple skin carcinomas. He requests further treatment and will require a Port-A-Cath.  He was instructed that complications consists of bleeding and infection and the possibility that of pneumothorax.  Informed consent was obtained.  We will plan to do this via the outpatient department.     Felicie Morn, [name]     WB/MEDQ  D:  08/21/2013  T:  08/22/2013  Job:  [id number]

## 2013-08-23 ENCOUNTER — Encounter (HOSPITAL_COMMUNITY): Payer: Self-pay | Admitting: Emergency Medicine

## 2013-08-23 ENCOUNTER — Encounter (HOSPITAL_COMMUNITY): Payer: Medicare Other | Admitting: Anesthesiology

## 2013-08-23 ENCOUNTER — Encounter (HOSPITAL_COMMUNITY)
Admission: RE | Disposition: A | Discharge status: Home / Self Care | Payer: Self-pay | Source: Ambulatory Visit | Attending: General Surgery

## 2013-08-23 ENCOUNTER — Ambulatory Visit (HOSPITAL_COMMUNITY): Payer: Medicare Other | Admitting: Anesthesiology

## 2013-08-23 ENCOUNTER — Ambulatory Visit (HOSPITAL_COMMUNITY)
Admission: RE | Admit: 2013-08-23 | Discharge: 2013-08-23 | Disposition: A | Discharge status: Home / Self Care | Payer: Medicare Other | Source: Ambulatory Visit | Attending: General Surgery | Admitting: General Surgery

## 2013-08-23 ENCOUNTER — Other Ambulatory Visit (HOSPITAL_COMMUNITY): Payer: Self-pay | Admitting: Radiology

## 2013-08-23 DIAGNOSIS — Z538 Procedure and treatment not carried out for other reasons: Secondary | ICD-10-CM | POA: Insufficient documentation

## 2013-08-23 DIAGNOSIS — C3490 Malignant neoplasm of unspecified part of unspecified bronchus or lung: Secondary | ICD-10-CM | POA: Insufficient documentation

## 2013-08-23 SURGERY — CANCELLED PROCEDURE
Anesthesia: Monitor Anesthesia Care

## 2013-08-23 MED ORDER — PROPOFOL 10 MG/ML IV BOLUS
INTRAVENOUS | Status: AC
  Filled 2013-08-23: qty 20

## 2013-08-23 MED ORDER — FENTANYL CITRATE 0.05 MG/ML IJ SOLN
25.0000 ug | INTRAMUSCULAR | Status: AC
  Administered 2013-08-23: 25 ug via INTRAVENOUS

## 2013-08-23 MED ORDER — MIDAZOLAM HCL 2 MG/2ML IJ SOLN
1.0000 mg | INTRAMUSCULAR | Status: DC | PRN
  Administered 2013-08-23: 2 mg via INTRAVENOUS

## 2013-08-23 MED ORDER — LIDOCAINE HCL (PF) 1 % IJ SOLN
INTRAMUSCULAR | Status: AC
  Filled 2013-08-23: qty 5

## 2013-08-23 MED ORDER — LACTATED RINGERS IV SOLN
INTRAVENOUS | Status: DC
  Administered 2013-08-23: 09:00:00 via INTRAVENOUS

## 2013-08-23 MED ORDER — LIDOCAINE HCL (PF) 1 % IJ SOLN
INTRAMUSCULAR | Status: AC
  Filled 2013-08-23: qty 30

## 2013-08-23 MED ORDER — BACITRACIN ZINC 500 UNIT/GM EX OINT
TOPICAL_OINTMENT | CUTANEOUS | Status: AC
  Filled 2013-08-23: qty 0.9

## 2013-08-23 MED ORDER — HEPARIN SOD (PORK) LOCK FLUSH 100 UNIT/ML IV SOLN
INTRAVENOUS | Status: AC
  Filled 2013-08-23: qty 5

## 2013-08-23 MED ORDER — CIPROFLOXACIN IN D5W 200 MG/100ML IV SOLN
200.0000 mg | Freq: Once | INTRAVENOUS | Status: DC
  Filled 2013-08-23: qty 100

## 2013-08-23 MED ORDER — MIDAZOLAM HCL 2 MG/2ML IJ SOLN
INTRAMUSCULAR | Status: AC
  Filled 2013-08-23: qty 2

## 2013-08-23 MED ORDER — BACITRACIN-NEOMYCIN-POLYMYXIN 400-5-5000 EX OINT
TOPICAL_OINTMENT | CUTANEOUS | Status: AC
  Filled 2013-08-23: qty 1

## 2013-08-23 MED ORDER — FENTANYL CITRATE 0.05 MG/ML IJ SOLN
INTRAMUSCULAR | Status: AC
  Filled 2013-08-23: qty 2

## 2013-08-23 SURGICAL SUPPLY — 43 items
APPLIER CLIP 9.375 SM OPEN (CLIP)
APR CLP SM 9.3 20 MLT OPN (CLIP)
BAG DECANTER FOR FLEXI CONT (MISCELLANEOUS) ×4 IMPLANT
BAG HAMPER (MISCELLANEOUS) ×3 IMPLANT
CLIP APPLIE 9.375 SM OPEN (CLIP) IMPLANT
CLOSURE WOUND 1/4 X3 (GAUZE/BANDAGES/DRESSINGS) ×1
CLOTH BEACON ORANGE TIMEOUT ST (SAFETY) ×4 IMPLANT
COVER LIGHT HANDLE STERIS (MISCELLANEOUS) ×8 IMPLANT
DECANTER SPIKE VIAL GLASS SM (MISCELLANEOUS) ×4 IMPLANT
DRAPE C-ARM FOLDED MOBILE STRL (DRAPES) ×4 IMPLANT
DRSG TEGADERM 2-3/8X2-3/4 SM (GAUZE/BANDAGES/DRESSINGS) ×4 IMPLANT
DURAPREP 26ML APPLICATOR (WOUND CARE) ×4 IMPLANT
ELECT REM PT RETURN 9FT ADLT (ELECTROSURGICAL) ×3
ELECTRODE REM PT RTRN 9FT ADLT (ELECTROSURGICAL) ×2 IMPLANT
GLOVE SKINSENSE NS SZ7.0 (GLOVE) ×2
GLOVE SKINSENSE STRL SZ7.0 (GLOVE) ×2 IMPLANT
GOWN STRL REUS W/TWL LRG LVL3 (GOWN DISPOSABLE) ×8 IMPLANT
IV NS 500ML (IV SOLUTION) ×3
IV NS 500ML BAXH (IV SOLUTION) ×2 IMPLANT
KIT PORT POWER 8FR ISP MRI (CATHETERS) ×4 IMPLANT
KIT ROOM TURNOVER APOR (KITS) ×4 IMPLANT
MANIFOLD NEPTUNE II (INSTRUMENTS) ×3 IMPLANT
NDL HYPO 18GX1.5 BLUNT FILL (NEEDLE) ×1 IMPLANT
NDL HYPO 25X1 1.5 SAFETY (NEEDLE) ×1 IMPLANT
NEEDLE HYPO 18GX1.5 BLUNT FILL (NEEDLE) ×3 IMPLANT
NEEDLE HYPO 25X1 1.5 SAFETY (NEEDLE) ×3 IMPLANT
PACK MINOR (CUSTOM PROCEDURE TRAY) ×4 IMPLANT
PAD ARMBOARD 7.5X6 YLW CONV (MISCELLANEOUS) ×4 IMPLANT
SET BASIN LINEN APH (SET/KITS/TRAYS/PACK) ×4 IMPLANT
SHEATH COOK PEEL AWAY SET 8F (SHEATH) IMPLANT
SPONGE GAUZE 2X2 8PLY STER LF (GAUZE/BANDAGES/DRESSINGS) ×1
SPONGE GAUZE 2X2 8PLY STRL LF (GAUZE/BANDAGES/DRESSINGS) ×3 IMPLANT
STRIP CLOSURE SKIN 1/4X3 (GAUZE/BANDAGES/DRESSINGS) ×3 IMPLANT
SUT VIC AB 4-0 SH 27 (SUTURE) ×3
SUT VIC AB 4-0 SH 27XBRD (SUTURE) ×2 IMPLANT
SUT VIC AB 5-0 P-3 18X BRD (SUTURE) ×2 IMPLANT
SUT VIC AB 5-0 P3 18 (SUTURE) ×3
SYR 20CC LL (SYRINGE) ×3 IMPLANT
SYR 5ML LL (SYRINGE) ×8 IMPLANT
SYR BULB IRRIGATION 50ML (SYRINGE) ×4 IMPLANT
SYR CONTROL 10ML LL (SYRINGE) ×4 IMPLANT
TOWEL OR 17X26 4PK STRL BLUE (TOWEL DISPOSABLE) ×3 IMPLANT
WATER STERILE IRR 1000ML POUR (IV SOLUTION) ×8 IMPLANT

## 2013-08-23 NOTE — OR Nursing (Signed)
MD at the bedside, Pt has new skin ulcer on right upper chest, wound care complete, MD to speak with family

## 2013-08-23 NOTE — Progress Notes (Signed)
Pt has ulcer on right clavicular are which is new.  Scaring and skin cancers Left clavicular area.  Will advise interventional radiology consult in this case.  Also the family is now changing their mind and are ambivalent about this procedure.  I advised them to discuss this further with their oncologist.  Surgery canceled.

## 2013-08-23 NOTE — OR Nursing (Signed)
Pt discharged from Pre-op, IV removed, pt dressed in wheelchair, family gone to get the truck

## 2013-08-23 NOTE — Anesthesia Preprocedure Evaluation (Signed)
Anesthesia Evaluation  Patient identified by MRN, date of birth, ID band Patient awake    Reviewed: Allergy & Precautions, H&P , NPO status , Patient's Chart, lab work & pertinent test results, reviewed documented beta blocker date and time   Airway Mallampati: II TM Distance: >3 FB     Dental  (+) Edentulous Upper and Edentulous Lower   Pulmonary shortness of breath, pneumonia -, resolved, former smoker,  L lung cancer breath sounds clear to auscultation        Cardiovascular hypertension, Pt. on medications and Pt. on home beta blockers + CAD, + Peripheral Vascular Disease and +CHF + dysrhythmias Rhythm:Regular Rate:Normal     Neuro/Psych    GI/Hepatic PUD,   Endo/Other    Renal/GU      Musculoskeletal   Abdominal   Peds  Hematology   Anesthesia Other Findings   Reproductive/Obstetrics                           Anesthesia Physical Anesthesia Plan  ASA: IV  Anesthesia Plan: MAC   Post-op Pain Management:    Induction: Intravenous  Airway Management Planned: Simple Face Mask  Additional Equipment:   Intra-op Plan:   Post-operative Plan:   Informed Consent: I have reviewed the patients History and Physical, chart, labs and discussed the procedure including the risks, benefits and alternatives for the proposed anesthesia with the patient or authorized representative who has indicated his/her understanding and acceptance.     Plan Discussed with:   Anesthesia Plan Comments:         Anesthesia Quick Evaluation

## 2013-08-23 NOTE — OR Nursing (Signed)
Per MD pt need radiology intervention, family aware

## 2013-08-24 ENCOUNTER — Other Ambulatory Visit (HOSPITAL_COMMUNITY): Payer: Self-pay | Admitting: General Surgery

## 2013-08-24 DIAGNOSIS — C3490 Malignant neoplasm of unspecified part of unspecified bronchus or lung: Secondary | ICD-10-CM

## 2013-08-27 ENCOUNTER — Other Ambulatory Visit (HOSPITAL_COMMUNITY): Payer: Medicare Other

## 2013-08-27 ENCOUNTER — Ambulatory Visit (HOSPITAL_COMMUNITY)
Admission: RE | Admit: 2013-08-27 | Discharge: 2013-08-27 | Disposition: A | Discharge status: Home / Self Care | Payer: Medicare Other | Source: Ambulatory Visit | Attending: General Surgery | Admitting: General Surgery

## 2013-08-27 ENCOUNTER — Encounter (HOSPITAL_COMMUNITY): Payer: Self-pay

## 2013-08-27 ENCOUNTER — Other Ambulatory Visit (HOSPITAL_COMMUNITY): Payer: Self-pay | Admitting: General Surgery

## 2013-08-27 DIAGNOSIS — C3490 Malignant neoplasm of unspecified part of unspecified bronchus or lung: Secondary | ICD-10-CM

## 2013-08-27 LAB — PROTIME-INR
INR: 1.12 (ref 0.00–1.49)
PROTHROMBIN TIME: 14.2 s (ref 11.6–15.2)

## 2013-08-27 LAB — BASIC METABOLIC PANEL
BUN: 25 mg/dL — AB (ref 6–23)
CO2: 28 mEq/L (ref 19–32)
CREATININE: 0.95 mg/dL (ref 0.50–1.35)
Calcium: 8.5 mg/dL (ref 8.4–10.5)
Chloride: 101 mEq/L (ref 96–112)
GFR calc Af Amer: 89 mL/min — ABNORMAL LOW (ref >90)
GFR, EST NON AFRICAN AMERICAN: 77 mL/min — AB (ref >90)
GLUCOSE: 82 mg/dL (ref 70–99)
POTASSIUM: 4.6 meq/L (ref 3.7–5.3)
Sodium: 138 mEq/L (ref 137–147)

## 2013-08-27 LAB — CBC
HEMATOCRIT: 36.6 % — AB (ref 39.0–52.0)
HEMOGLOBIN: 11.5 g/dL — AB (ref 13.0–17.0)
MCH: 26 pg (ref 26.0–34.0)
MCHC: 31.4 g/dL (ref 30.0–36.0)
MCV: 82.8 fL (ref 78.0–100.0)
Platelets: 175 10*3/uL (ref 150–400)
RBC: 4.42 MIL/uL (ref 4.22–5.81)
RDW: 16.8 % — AB (ref 11.5–15.5)
WBC: 11.2 10*3/uL — ABNORMAL HIGH (ref 4.0–10.5)

## 2013-08-27 LAB — APTT: aPTT: 36 seconds (ref 24–37)

## 2013-08-27 MED ORDER — LIDOCAINE HCL 1 % IJ SOLN
INTRAMUSCULAR | Status: AC
  Filled 2013-08-27: qty 20

## 2013-08-27 MED ORDER — VANCOMYCIN HCL IN DEXTROSE 1-5 GM/200ML-% IV SOLN: 1000.0000 mg | Freq: Once | INTRAVENOUS | Status: DC

## 2013-08-27 MED ORDER — SODIUM CHLORIDE 0.9 % IV SOLN
Freq: Once | INTRAVENOUS | Status: AC
  Administered 2013-08-27: 13:00:00 via INTRAVENOUS

## 2013-08-27 MED ORDER — HEPARIN SOD (PORK) LOCK FLUSH 100 UNIT/ML IV SOLN
INTRAVENOUS | Status: AC
  Filled 2013-08-27: qty 5

## 2013-08-27 NOTE — Discharge Instructions (Signed)
PICC Insertion, Care After Refer to this sheet in the next few weeks. These instructions provide you with information on caring for yourself after your procedure. Your health care provider may also give you more specific instructions. Your treatment has been planned according to current medical practices, but problems sometimes occur. Call your health care provider if you have any problems or questions after your procedure. WHAT TO EXPECT AFTER THE PROCEDURE After your procedure, it is typical to have the following:  Mild discomfort at the insertion site. This should not last more than a day. HOME CARE INSTRUCTIONS  Rest at home for the remainder of the day after the procedure.  You may bend your arm and move it freely. If your PICC is near or at the bend of your elbow, avoid activity with repeated motion at the elbow.  Avoid lifting heavy objects as instructed by your health care provider.  Avoid using a crutch with the arm on the same side as your PICC. You may need to use a walker. Bandage Care  Keep your PICC bandage (dressing) clean and dry to prevent infection.  Ask your health care provider when you may shower. To keep the dressing dry, cover the PICC with plastic wrap and tape before showering. If the dressing does become wet, replace it right after the shower.  Do not soak in the bath, swim, or use hot tubs when you have a PICC.  Change the PICC dressing as instructed by your health care provider.  Change your PICC dressing if it becomes loose or wet. General PICC Care  Check the PICC insertion site daily for leakage, redness, swelling, or pain.  Flush the PICC as directed by your health care provider. Let your health care provider know right away if the PICC is difficult to flush or does not flush. Do not use force to flush the PICC.   Do not use a syringe that is less than 10 mL to flush the PICC.  Never pull or tug on the PICC.  Avoid blood pressure checks on the arm  with the PICC.  Keep your PICC identification card with you at all times.  Do not take the PICC out yourself. Only a trained health care professional should remove the PICC.  SEEK MEDICAL CARE IF:  You have pain in your arm, ear, face, or teeth.  You have fever or chills.  You have drainage from the PICC insertion site.  You have redness or palpate a "cord" around the PICC insertion site.  You cannot flush the catheter. SEEK IMMEDIATE MEDICAL CARE IF:  You have swelling in the arm in which the PICC is inserted. Document Released: 05/16/2013 Document Reviewed: 04/02/2013 [facility] Patient Information 2014 [place], [place].

## 2013-08-27 NOTE — Procedures (Signed)
Successful RUE DL POWER PICC TIP SVC/RA NO COMP STABLE READY FOR USE

## 2013-08-27 NOTE — Progress Notes (Signed)
Pt had piccline placed today for chemotherapy.  Dr.Neistrom's office to maintain piccline.

## 2013-08-29 ENCOUNTER — Other Ambulatory Visit: Payer: Self-pay | Admitting: *Deleted

## 2013-08-29 ENCOUNTER — Ambulatory Visit
Admission: RE | Admit: 2013-08-29 | Discharge: 2013-08-29 | Disposition: A | Discharge status: Home / Self Care | Payer: Medicare Other | Source: Ambulatory Visit | Attending: Cardiothoracic Surgery | Admitting: Cardiothoracic Surgery

## 2013-08-29 ENCOUNTER — Ambulatory Visit (INDEPENDENT_AMBULATORY_CARE_PROVIDER_SITE_OTHER): Payer: Medicare Other | Admitting: Family Medicine

## 2013-08-29 ENCOUNTER — Encounter: Payer: Self-pay | Admitting: Family Medicine

## 2013-08-29 ENCOUNTER — Ambulatory Visit (INDEPENDENT_AMBULATORY_CARE_PROVIDER_SITE_OTHER): Payer: Medicare Other | Admitting: Cardiothoracic Surgery

## 2013-08-29 VITALS — BP 98/68 | HR 80 | Ht 72.0 in | Wt 172.6 lb

## 2013-08-29 VITALS — BP 80/51 | HR 92 | Resp 16 | Ht 72.0 in | Wt 160.0 lb

## 2013-08-29 DIAGNOSIS — R000 Tachycardia, unspecified: Secondary | ICD-10-CM

## 2013-08-29 DIAGNOSIS — C3490 Malignant neoplasm of unspecified part of unspecified bronchus or lung: Secondary | ICD-10-CM

## 2013-08-29 DIAGNOSIS — J90 Pleural effusion, not elsewhere classified: Secondary | ICD-10-CM

## 2013-08-29 DIAGNOSIS — J910 Malignant pleural effusion: Secondary | ICD-10-CM

## 2013-08-29 DIAGNOSIS — I951 Orthostatic hypotension: Secondary | ICD-10-CM

## 2013-08-29 DIAGNOSIS — I4891 Unspecified atrial fibrillation: Secondary | ICD-10-CM

## 2013-08-29 NOTE — Patient Instructions (Signed)
STOP lisinopril due to blood pressure being low  Recheck here in 4 weeks

## 2013-08-29 NOTE — Progress Notes (Signed)
PCP is Stephen Lange, MD Referring Provider is Stephen Gobble, MD  Chief Complaint  Patient presents with  . Pleural Effusion    f/u after bx with cxr    HPI: 80 year old Caucasian male presents to the office in a wheelchair with widely metastatic non-small cell carcinoma of the lung. He is to begin brain radiation therapy for palliation in the next 48 hours. He complains of coughing and some shortness of breath and chest x-ray shows a moderate left pleural effusion. The patient had a thoracentesis of left pleural effusion in late November 2014, cytology negative. He has ejection fraction of 15% and heart failure. The patient and his daughter present to discuss options for treating the pleural effusion.  I do not predict the patient would benefit from left Pleurx catheter ordered probably survive the hospitalization. I recommended that the patient undergo ultrasound guided left thoracentesis because of previous high-dose radiation to the left chest for previously treated stage IIIB lung cancer.   Past Medical History  Diagnosis Date  . Hypertension   . Arteriosclerotic cardiovascular disease (ASCVD)     Echocardiogram in 2008: Mild LVH with normal EF; cardiac cath in 09/2005-nonobstructive coronary disease with 50% LAD  . Nonsustained ventricular tachycardia   . Carotid bruit     Right; Carotid ultrasound in 08/2006: scattered atherosclerotic plaque without significant focal disease  . Paroxysmal atrial flutter     Apical left ventricular thrombus and 01/2011  . Chronic anticoagulation   . Abdominal wall hematoma     Admitted 01/2011; supratherapeutic INR  . Cardiomyopathy     EF of 20-25 % 06/2013 and 30% in 01/2011  . Upper GI bleed   . BPH (benign prostatic hypertrophy) 06/14/2011  . Hx of radiation therapy 03/04/04- 04/22/04    left parotid bed, upper jugular chain 45 Gy, 25 fx, parotid bed boosted 63 Gy 9 fx  . Squamous cell carcinoma 2005    left face, left parotid gland  . History  of radiation therapy 09/16/05- 11/05/05    central mediastinum, chest  . Carcinoma, lung 2007    Left upper lobe; [place], stage III; treated with RT and chemotherapy  . Multiple Skin cancer 06/14/2011  . PUD (peptic ulcer disease)   . Melanoma     chest  . 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 Atrial fibrillation   . Non-small cell lung cancer     Left upper lobe; treated with concomitant radiation and chemotherapy, then post radiation chemotherapy finishing all as of 04/25/2006 without recurrence.   . Pancreatic insufficiency 08/19/2013    Past Surgical History  Procedure Laterality Date  . Inguinal hernia repair    . Cholecystectomy    . Laparoscopic gastrotomy w/ repair of ulcer    . Skin lesion    . Skin biopsy    . Mohs surgery  05/01/2013    melanoma of chest  . 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 Skin graft Left 11/2003    left face  . Neck dissection Left     resection of squamous cell carcinoma-skin  . Lung biopsy      Family History  Problem Relation Age of Onset  . Stroke Mother   . Hypertension Mother   . Alzheimer's disease Father   . Skin cancer Sister   . Cancer Daughter     Social History History  Substance Use Topics  . Smoking status: Former Smoker  Types: Cigarettes  . Smokeless tobacco: Former Systems developer    Quit date: 05/29/1953  . Alcohol Use: No    Current Outpatient Prescriptions  Medication Sig Dispense Refill  . acetaminophen (TYLENOL) 500 MG tablet Take 500 mg by mouth every 6 (six) hours as needed for mild pain.       Marland Kitchen ALPRAZolam (XANAX) 0.5 MG tablet Take 0.5 mg by mouth 2 (two) times daily as needed for anxiety.       Marland Kitchen aspirin EC 81 MG tablet Take 81 mg by mouth daily.      . carvedilol (COREG) 6.25 MG tablet Take 1 tablet (6.25 mg total) by mouth 2 (two) times daily with a meal. New medication for your blood pressure and your heart.  60 tablet  6  . dexamethasone (DECADRON) 4 MG tablet Take 4 mg by mouth 3  (three) times daily.      . furosemide (LASIX) 40 MG tablet Take 40 mg by mouth every morning.      . lipase/protease/amylase (CREON-12/PANCREASE) 12000 UNITS CPEP capsule Take 1 capsule by mouth 3 (three) times daily with meals. Medication to help with food digestion.  270 capsule  6  . lisinopril (PRINIVIL,ZESTRIL) 5 MG tablet Take 1 tablet (5 mg total) by mouth daily. New medication for your blood pressure and your heart.  30 tablet  6  . oxybutynin (DITROPAN) 5 MG tablet Take 1 tablet (5 mg total) by mouth daily.  30 tablet  6  . pantoprazole (PROTONIX) 40 MG tablet Take 1 tablet (40 mg total) by mouth daily. Medication to treat gastric acid reflux.  30 tablet  6  . prochlorperazine (COMPAZINE) 10 MG tablet        No current facility-administered medications for this visit.    Allergies  Allergen Reactions  . Amoxil [Amoxicillin]     RASH  . Cefzil [Cefprozil]     unknown  . Levaquin [Levofloxacin In D5w] Rash and Other (See Comments)    Developed rash after 2 rounds of IV and PO meds. Family unsure if this is the actual culprit of the rash    Review of Systems patient more confused, weaker, still able walk very short distances only  BP 80/51  Pulse 92  Resp 16  Ht 6' (1.829 m)  Wt 160 lb (72.576 kg)  BMI 21.70 kg/m2  SpO2 95% Physical Exam Minimally responsive in wheelchair Diminished breath sounds on the left 2+ pedal edema   Diagnostic Tests: Moderate left pleural effusion chest x-ray  Impression: Recurrent left pleural effusion since thoracentesis late November 2014. We'll arrange for ultrasound guided left thoracentesis to help improve his symptoms. Not a candidate for Pleurx catheter  Plan: Patient will return as able in 3-4 weeks with chest x-ray

## 2013-08-29 NOTE — Progress Notes (Signed)
   Subjective:    Patient ID: Stephen Martin, male    DOB: [birth year], 80 y.o.   MRN: [id number]  HPIFollow up from hospital stay for nausea and vomiting. Patient is over this currently.  Fluid in left leg and hands. Uses diuretic intermittently.  Weakness in right leg. Going on for awhile. Getting worse last week. Fell twice last week. This patient is getting progressively weaker. He fair. But he is losing his battle to cancer.   Blood pressure low today 80/61.   Rash on leg. Thinks it is from the levaquin. Finished antibiotic on Sunday.  Hospital laboratory, testing, x-rays, discharge note were all reviewed. Medication list reviewed as well.   Review of Systems Patient denies chest pain shortness breath headaches he does find himself feeling very weak denies cough states no pain    Objective:   Physical Exam His lungs are clear hearts regular pulse is weak but present his blood pressure is low extremities no edema blood pressure sitting 120/74 when he stood it dropped down to 108/70       Assessment & Plan:  #1 hypotension-stop lisinopril. He should followup again with [place] in 3 weeks' time we'll recheck blood pressure/orthostatics if progressive weakness dizziness or other problems followup sooner #2 metastatic cancer poor outlook. Patient does not want to be on a ventilator but does want other resuscitation. My personal feeling is this patient is at a point where hospice care may well be the best thing for him. At this present moment. He does not want to give up. #3 there is no sign of re\re get a ring of pleural effusion #4 tachyarrhythmia under good control currently continue the Coreg

## 2013-08-30 ENCOUNTER — Ambulatory Visit (HOSPITAL_COMMUNITY)
Admission: RE | Admit: 2013-08-30 | Discharge: 2013-08-30 | Disposition: A | Discharge status: Home / Self Care | Payer: Medicare Other | Source: Ambulatory Visit | Attending: Cardiothoracic Surgery | Admitting: Cardiothoracic Surgery

## 2013-08-30 ENCOUNTER — Ambulatory Visit (HOSPITAL_COMMUNITY)
Admission: RE | Admit: 2013-08-30 | Discharge: 2013-08-30 | Disposition: A | Discharge status: Home / Self Care | Payer: Medicare Other | Source: Ambulatory Visit | Attending: Radiology | Admitting: Radiology

## 2013-08-30 DIAGNOSIS — J984 Other disorders of lung: Secondary | ICD-10-CM | POA: Insufficient documentation

## 2013-08-30 DIAGNOSIS — J90 Pleural effusion, not elsewhere classified: Secondary | ICD-10-CM

## 2013-08-30 DIAGNOSIS — C3490 Malignant neoplasm of unspecified part of unspecified bronchus or lung: Secondary | ICD-10-CM | POA: Insufficient documentation

## 2013-08-30 NOTE — Procedures (Signed)
US guided L thora  2.2Liters blood tinged fluid removed No complication  Pt did well cxr pending

## 2013-09-17 ENCOUNTER — Other Ambulatory Visit: Payer: Self-pay | Admitting: *Deleted

## 2013-09-17 DIAGNOSIS — J90 Pleural effusion, not elsewhere classified: Secondary | ICD-10-CM

## 2013-09-18 ENCOUNTER — Ambulatory Visit: Payer: Medicare Other | Admitting: Family Medicine

## 2013-09-19 ENCOUNTER — Ambulatory Visit: Payer: Medicare Other | Admitting: Family Medicine

## 2013-09-19 ENCOUNTER — Ambulatory Visit: Payer: Medicare Other | Admitting: Cardiothoracic Surgery

## 2013-10-02 ENCOUNTER — Encounter (HOSPITAL_COMMUNITY): Payer: Self-pay | Admitting: Emergency Medicine

## 2013-10-02 ENCOUNTER — Inpatient Hospital Stay (HOSPITAL_COMMUNITY)
Admission: EM | Admit: 2013-10-02 | Discharge: 2013-11-07 | DRG: 871 | Disposition: E | Payer: Medicare Other | Attending: Internal Medicine | Admitting: Internal Medicine

## 2013-10-02 ENCOUNTER — Emergency Department (HOSPITAL_COMMUNITY): Payer: Medicare Other

## 2013-10-02 DIAGNOSIS — E876 Hypokalemia: Secondary | ICD-10-CM

## 2013-10-02 DIAGNOSIS — R110 Nausea: Secondary | ICD-10-CM

## 2013-10-02 DIAGNOSIS — C7949 Secondary malignant neoplasm of other parts of nervous system: Secondary | ICD-10-CM

## 2013-10-02 DIAGNOSIS — C4490 Unspecified malignant neoplasm of skin, unspecified: Secondary | ICD-10-CM

## 2013-10-02 DIAGNOSIS — Z7982 Long term (current) use of aspirin: Secondary | ICD-10-CM

## 2013-10-02 DIAGNOSIS — A419 Sepsis, unspecified organism: Secondary | ICD-10-CM | POA: Diagnosis present

## 2013-10-02 DIAGNOSIS — N400 Enlarged prostate without lower urinary tract symptoms: Secondary | ICD-10-CM

## 2013-10-02 DIAGNOSIS — K5792 Diverticulitis of intestine, part unspecified, without perforation or abscess without bleeding: Secondary | ICD-10-CM | POA: Diagnosis present

## 2013-10-02 DIAGNOSIS — Z820 Family history of epilepsy and other diseases of the nervous system: Secondary | ICD-10-CM

## 2013-10-02 DIAGNOSIS — Z808 Family history of malignant neoplasm of other organs or systems: Secondary | ICD-10-CM

## 2013-10-02 DIAGNOSIS — K5289 Other specified noninfective gastroenteritis and colitis: Secondary | ICD-10-CM | POA: Diagnosis present

## 2013-10-02 DIAGNOSIS — C3490 Malignant neoplasm of unspecified part of unspecified bronchus or lung: Secondary | ICD-10-CM | POA: Diagnosis present

## 2013-10-02 DIAGNOSIS — I4729 Other ventricular tachycardia: Secondary | ICD-10-CM | POA: Diagnosis present

## 2013-10-02 DIAGNOSIS — IMO0002 Reserved for concepts with insufficient information to code with codable children: Secondary | ICD-10-CM

## 2013-10-02 DIAGNOSIS — Z85820 Personal history of malignant melanoma of skin: Secondary | ICD-10-CM

## 2013-10-02 DIAGNOSIS — Z515 Encounter for palliative care: Secondary | ICD-10-CM

## 2013-10-02 DIAGNOSIS — T451X5A Adverse effect of antineoplastic and immunosuppressive drugs, initial encounter: Secondary | ICD-10-CM | POA: Diagnosis present

## 2013-10-02 DIAGNOSIS — I428 Other cardiomyopathies: Secondary | ICD-10-CM | POA: Diagnosis present

## 2013-10-02 DIAGNOSIS — E871 Hypo-osmolality and hyponatremia: Secondary | ICD-10-CM | POA: Diagnosis present

## 2013-10-02 DIAGNOSIS — K8689 Other specified diseases of pancreas: Secondary | ICD-10-CM | POA: Diagnosis present

## 2013-10-02 DIAGNOSIS — C7931 Secondary malignant neoplasm of brain: Secondary | ICD-10-CM | POA: Diagnosis present

## 2013-10-02 DIAGNOSIS — R7881 Bacteremia: Secondary | ICD-10-CM

## 2013-10-02 DIAGNOSIS — I498 Other specified cardiac arrhythmias: Secondary | ICD-10-CM

## 2013-10-02 DIAGNOSIS — D696 Thrombocytopenia, unspecified: Secondary | ICD-10-CM | POA: Diagnosis present

## 2013-10-02 DIAGNOSIS — J90 Pleural effusion, not elsewhere classified: Secondary | ICD-10-CM | POA: Diagnosis present

## 2013-10-02 DIAGNOSIS — Z9221 Personal history of antineoplastic chemotherapy: Secondary | ICD-10-CM

## 2013-10-02 DIAGNOSIS — R112 Nausea with vomiting, unspecified: Secondary | ICD-10-CM

## 2013-10-02 DIAGNOSIS — K529 Noninfective gastroenteritis and colitis, unspecified: Secondary | ICD-10-CM | POA: Diagnosis present

## 2013-10-02 DIAGNOSIS — R6521 Severe sepsis with septic shock: Secondary | ICD-10-CM

## 2013-10-02 DIAGNOSIS — R000 Tachycardia, unspecified: Secondary | ICD-10-CM | POA: Diagnosis present

## 2013-10-02 DIAGNOSIS — N390 Urinary tract infection, site not specified: Secondary | ICD-10-CM | POA: Diagnosis present

## 2013-10-02 DIAGNOSIS — I509 Heart failure, unspecified: Secondary | ICD-10-CM | POA: Diagnosis present

## 2013-10-02 DIAGNOSIS — I5022 Chronic systolic (congestive) heart failure: Secondary | ICD-10-CM | POA: Diagnosis present

## 2013-10-02 DIAGNOSIS — I429 Cardiomyopathy, unspecified: Secondary | ICD-10-CM | POA: Diagnosis present

## 2013-10-02 DIAGNOSIS — I4891 Unspecified atrial fibrillation: Secondary | ICD-10-CM | POA: Diagnosis present

## 2013-10-02 DIAGNOSIS — D701 Agranulocytosis secondary to cancer chemotherapy: Secondary | ICD-10-CM | POA: Diagnosis present

## 2013-10-02 DIAGNOSIS — Z7901 Long term (current) use of anticoagulants: Secondary | ICD-10-CM

## 2013-10-02 DIAGNOSIS — Z8249 Family history of ischemic heart disease and other diseases of the circulatory system: Secondary | ICD-10-CM

## 2013-10-02 DIAGNOSIS — I472 Ventricular tachycardia, unspecified: Secondary | ICD-10-CM | POA: Diagnosis present

## 2013-10-02 DIAGNOSIS — Z66 Do not resuscitate: Secondary | ICD-10-CM | POA: Diagnosis present

## 2013-10-02 DIAGNOSIS — I10 Essential (primary) hypertension: Secondary | ICD-10-CM

## 2013-10-02 DIAGNOSIS — D702 Other drug-induced agranulocytosis: Secondary | ICD-10-CM

## 2013-10-02 DIAGNOSIS — I2510 Atherosclerotic heart disease of native coronary artery without angina pectoris: Secondary | ICD-10-CM

## 2013-10-02 DIAGNOSIS — Z87891 Personal history of nicotine dependence: Secondary | ICD-10-CM

## 2013-10-02 DIAGNOSIS — Z823 Family history of stroke: Secondary | ICD-10-CM

## 2013-10-02 DIAGNOSIS — J910 Malignant pleural effusion: Secondary | ICD-10-CM | POA: Diagnosis present

## 2013-10-02 DIAGNOSIS — A4189 Other specified sepsis: Principal | ICD-10-CM | POA: Diagnosis present

## 2013-10-02 DIAGNOSIS — R0989 Other specified symptoms and signs involving the circulatory and respiratory systems: Secondary | ICD-10-CM

## 2013-10-02 DIAGNOSIS — Z923 Personal history of irradiation: Secondary | ICD-10-CM

## 2013-10-02 DIAGNOSIS — J189 Pneumonia, unspecified organism: Secondary | ICD-10-CM

## 2013-10-02 DIAGNOSIS — I951 Orthostatic hypotension: Secondary | ICD-10-CM

## 2013-10-02 DIAGNOSIS — R1084 Generalized abdominal pain: Secondary | ICD-10-CM

## 2013-10-02 DIAGNOSIS — K5732 Diverticulitis of large intestine without perforation or abscess without bleeding: Secondary | ICD-10-CM | POA: Diagnosis present

## 2013-10-02 DIAGNOSIS — S301XXA Contusion of abdominal wall, initial encounter: Secondary | ICD-10-CM

## 2013-10-02 DIAGNOSIS — R6520 Severe sepsis without septic shock: Secondary | ICD-10-CM

## 2013-10-02 DIAGNOSIS — C439 Malignant melanoma of skin, unspecified: Secondary | ICD-10-CM | POA: Diagnosis present

## 2013-10-02 LAB — COMPREHENSIVE METABOLIC PANEL
ALT: 21 U/L (ref 0–53)
AST: 17 U/L (ref 0–37)
Albumin: 2.2 g/dL — ABNORMAL LOW (ref 3.5–5.2)
Alkaline Phosphatase: 95 U/L (ref 39–117)
BUN: 32 mg/dL — AB (ref 6–23)
CALCIUM: 8.3 mg/dL — AB (ref 8.4–10.5)
CO2: 27 mEq/L (ref 19–32)
CREATININE: 0.79 mg/dL (ref 0.50–1.35)
Chloride: 94 mEq/L — ABNORMAL LOW (ref 96–112)
GFR calc non Af Amer: 83 mL/min — ABNORMAL LOW (ref >90)
Glucose, Bld: 82 mg/dL (ref 70–99)
Potassium: 4.9 mEq/L (ref 3.7–5.3)
Sodium: 130 mEq/L — ABNORMAL LOW (ref 137–147)
TOTAL PROTEIN: 5.9 g/dL — AB (ref 6.0–8.3)
Total Bilirubin: 1.6 mg/dL — ABNORMAL HIGH (ref 0.3–1.2)

## 2013-10-02 LAB — CBC WITH DIFFERENTIAL/PLATELET
HCT: 40.2 % (ref 39.0–52.0)
Hemoglobin: 12.9 g/dL — ABNORMAL LOW (ref 13.0–17.0)
MCH: 27.6 pg (ref 26.0–34.0)
MCHC: 32.1 g/dL (ref 30.0–36.0)
MCV: 86.1 fL (ref 78.0–100.0)
Platelets: 33 10*3/uL — ABNORMAL LOW (ref 150–400)
RBC: 4.67 MIL/uL (ref 4.22–5.81)
RDW: 21 % — AB (ref 11.5–15.5)
WBC: 0.3 10*3/uL — CL (ref 4.0–10.5)

## 2013-10-02 LAB — LACTIC ACID, PLASMA: Lactic Acid, Venous: 1.5 mmol/L (ref 0.5–2.2)

## 2013-10-02 MED ORDER — SODIUM CHLORIDE 0.9 % IV SOLN
INTRAVENOUS | Status: AC
  Administered 2013-10-02: 22:00:00 via INTRAVENOUS

## 2013-10-02 MED ORDER — PANTOPRAZOLE SODIUM 40 MG PO TBEC
40.0000 mg | DELAYED_RELEASE_TABLET | Freq: Every day | ORAL | Status: DC
  Administered 2013-10-03: 40 mg via ORAL
  Filled 2013-10-02: qty 1

## 2013-10-02 MED ORDER — MORPHINE SULFATE 4 MG/ML IJ SOLN
4.0000 mg | Freq: Once | INTRAMUSCULAR | Status: AC
  Administered 2013-10-02: 4 mg via INTRAVENOUS
  Filled 2013-10-02: qty 1

## 2013-10-02 MED ORDER — SODIUM CHLORIDE 0.9 % IV SOLN
INTRAVENOUS | Status: AC
  Filled 2013-10-02 (×2): qty 500

## 2013-10-02 MED ORDER — ENOXAPARIN SODIUM 40 MG/0.4ML ~~LOC~~ SOLN
40.0000 mg | SUBCUTANEOUS | Status: DC
  Administered 2013-10-03 (×2): 40 mg via SUBCUTANEOUS
  Filled 2013-10-02 (×2): qty 0.4

## 2013-10-02 MED ORDER — SODIUM CHLORIDE 0.9 % IV SOLN
500.0000 mg | Freq: Once | INTRAVENOUS | Status: AC
  Administered 2013-10-02: 500 mg via INTRAVENOUS
  Filled 2013-10-02: qty 500

## 2013-10-02 MED ORDER — CARVEDILOL 3.125 MG PO TABS: 6.2500 mg | ORAL_TABLET | Freq: Two times a day (BID) | ORAL | Status: DC

## 2013-10-02 MED ORDER — SODIUM CHLORIDE 0.9 % IV SOLN
500.0000 mg | Freq: Three times a day (TID) | INTRAVENOUS | Status: DC
Start: 2013-10-03 — End: 2013-10-05
  Administered 2013-10-03 – 2013-10-05 (×7): 500 mg via INTRAVENOUS
  Filled 2013-10-02 (×8): qty 500

## 2013-10-02 MED ORDER — ONDANSETRON HCL 4 MG PO TABS: 4.0000 mg | ORAL_TABLET | Freq: Four times a day (QID) | ORAL | Status: DC | PRN

## 2013-10-02 MED ORDER — ONDANSETRON 8 MG PO TBDP
4.0000 mg | ORAL_TABLET | Freq: Three times a day (TID) | ORAL | Status: DC | PRN
  Filled 2013-10-02: qty 1

## 2013-10-02 MED ORDER — SODIUM CHLORIDE 0.9 % IV BOLUS (SEPSIS)
1000.0000 mL | Freq: Once | INTRAVENOUS | Status: AC
  Administered 2013-10-02: 1000 mL via INTRAVENOUS

## 2013-10-02 MED ORDER — ONDANSETRON HCL 4 MG/2ML IJ SOLN
4.0000 mg | Freq: Once | INTRAMUSCULAR | Status: AC
  Administered 2013-10-02: 4 mg via INTRAVENOUS
  Filled 2013-10-02: qty 2

## 2013-10-02 MED ORDER — HYDROMORPHONE HCL PF 1 MG/ML IJ SOLN: 1.0000 mg | INTRAMUSCULAR | Status: DC | PRN

## 2013-10-02 MED ORDER — ONDANSETRON HCL 4 MG/2ML IJ SOLN: 4.0000 mg | Freq: Four times a day (QID) | INTRAMUSCULAR | Status: DC | PRN

## 2013-10-02 MED ORDER — IOHEXOL 300 MG/ML  SOLN
100.0000 mL | Freq: Once | INTRAMUSCULAR | Status: AC | PRN
  Administered 2013-10-02: 100 mL via INTRAVENOUS

## 2013-10-02 MED ORDER — ONDANSETRON HCL 4 MG/2ML IJ SOLN
4.0000 mg | Freq: Four times a day (QID) | INTRAMUSCULAR | Status: DC | PRN
  Administered 2013-10-02: 4 mg via INTRAVENOUS
  Filled 2013-10-02: qty 2

## 2013-10-02 MED ORDER — SODIUM CHLORIDE 0.9 % IV BOLUS (SEPSIS)
500.0000 mL | Freq: Once | INTRAVENOUS | Status: AC
  Administered 2013-10-02: 500 mL via INTRAVENOUS

## 2013-10-02 MED ORDER — HYDROMORPHONE HCL PF 1 MG/ML IJ SOLN
1.0000 mg | Freq: Once | INTRAMUSCULAR | Status: AC
  Administered 2013-10-02: 1 mg via INTRAVENOUS
  Filled 2013-10-02: qty 1

## 2013-10-02 MED ORDER — ALPRAZOLAM 0.5 MG PO TABS: 0.5000 mg | ORAL_TABLET | Freq: Two times a day (BID) | ORAL | Status: DC | PRN

## 2013-10-02 MED ORDER — DEXAMETHASONE 4 MG PO TABS
4.0000 mg | ORAL_TABLET | Freq: Three times a day (TID) | ORAL | Status: DC
  Administered 2013-10-03: 4 mg via ORAL
  Filled 2013-10-02: qty 1

## 2013-10-02 MED ORDER — ALUM & MAG HYDROXIDE-SIMETH 200-200-20 MG/5ML PO SUSP: 30.0000 mL | Freq: Four times a day (QID) | ORAL | Status: DC | PRN

## 2013-10-02 MED ORDER — ACETAMINOPHEN 500 MG PO TABS
500.0000 mg | ORAL_TABLET | Freq: Four times a day (QID) | ORAL | Status: DC | PRN
  Administered 2013-10-03: 500 mg via ORAL
  Filled 2013-10-02: qty 1

## 2013-10-02 MED ORDER — ASPIRIN EC 81 MG PO TBEC
81.0000 mg | DELAYED_RELEASE_TABLET | Freq: Every day | ORAL | Status: DC
  Administered 2013-10-03: 81 mg via ORAL
  Filled 2013-10-02: qty 1

## 2013-10-02 MED ORDER — PROCHLORPERAZINE MALEATE 5 MG PO TABS: 10.0000 mg | ORAL_TABLET | Freq: Four times a day (QID) | ORAL | Status: DC | PRN

## 2013-10-02 MED ORDER — OXYBUTYNIN CHLORIDE 5 MG PO TABS
5.0000 mg | ORAL_TABLET | Freq: Every day | ORAL | Status: DC
  Administered 2013-10-03: 5 mg via ORAL
  Filled 2013-10-02: qty 1

## 2013-10-02 MED ORDER — SODIUM CHLORIDE 0.9 % IV SOLN
500.0000 mg | Freq: Three times a day (TID) | INTRAVENOUS | Status: DC
  Filled 2013-10-02 (×3): qty 500

## 2013-10-02 MED ORDER — HYDROMORPHONE HCL PF 1 MG/ML IJ SOLN
0.5000 mg | INTRAMUSCULAR | Status: DC | PRN
  Administered 2013-10-04: 0.25 mg via INTRAVENOUS
  Filled 2013-10-02: qty 1

## 2013-10-02 MED ORDER — HEPARIN SOD (PORK) LOCK FLUSH 100 UNIT/ML IV SOLN
INTRAVENOUS | Status: AC
  Administered 2013-10-02: 21:00:00
  Filled 2013-10-02: qty 5

## 2013-10-02 NOTE — H&P (Addendum)
PCP:   [name], MD   Chief Complaint:  abd pain  HPI: 80 yo male with lung cancer mets to brain on chemo, loculated left pleural effusion opted not to drain, aflutter, comes in with lower abd pain and diarrhea for over 24 hours.  Has also had some n/v nonbloody.  He feels awful.  Diarrhea nonbloody.  No fevers at home.  Ct shows prob acute diverticlulitis.  Has not been doing well with the chemo and also getting radiation to brain mets.  Review of Systems:  Positive and negative as per HPI otherwise all other systems are negative  Past Medical History: Past Medical History  Diagnosis Date  . Hypertension   . Arteriosclerotic cardiovascular disease (ASCVD)     Echocardiogram in 2008: Mild LVH with normal EF; cardiac cath in 09/2005-nonobstructive coronary disease with 50% LAD  . Nonsustained ventricular tachycardia   . Carotid bruit     Right; Carotid ultrasound in 08/2006: scattered atherosclerotic plaque without significant focal disease  . Paroxysmal atrial flutter     Apical left ventricular thrombus and 01/2011  . Chronic anticoagulation   . Abdominal wall hematoma     Admitted 01/2011; supratherapeutic INR  . Cardiomyopathy     EF of 20-25 % 06/2013 and 30% in 01/2011  . Upper GI bleed   . BPH (benign prostatic hypertrophy) 06/14/2011  . Hx of radiation therapy 03/04/04- 04/22/04    left parotid bed, upper jugular chain 45 Gy, 25 fx, parotid bed boosted 63 Gy 9 fx  . Squamous cell carcinoma 2005    left face, left parotid gland  . History of radiation therapy 09/16/05- 11/05/05    central mediastinum, chest  . Carcinoma, lung 2007    Left upper lobe; [place], stage III; treated with RT and chemotherapy  . Multiple Skin cancer 06/14/2011  . PUD (peptic ulcer disease)   . Melanoma     chest  . 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 Atrial fibrillation   . Non-small cell lung cancer     Left upper lobe; treated with concomitant radiation and chemotherapy,  then post radiation chemotherapy finishing all as of 04/25/2006 without recurrence.   . Pancreatic insufficiency 08/19/2013   Past Surgical History  Procedure Laterality Date  . Inguinal hernia repair    . Cholecystectomy    . Laparoscopic gastrotomy w/ repair of ulcer    . Skin lesion    . Skin biopsy    . Mohs surgery  05/01/2013    melanoma of chest  . 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 Skin graft Left 11/2003    left face  . Neck dissection Left     resection of squamous cell carcinoma-skin  . Lung biopsy    . Thoracentesis    . Sqamous cell      Medications: Prior to Admission medications   Medication Sig Start Date End Date Taking? Authorizing Provider  acetaminophen (TYLENOL) 500 MG tablet Take 500 mg by mouth every 6 (six) hours as needed for mild pain.    Yes Historical Provider, MD  aspirin EC 81 MG tablet Take 81 mg by mouth daily.   Yes Historical Provider, MD  carvedilol (COREG) 6.25 MG tablet Take 1 tablet (6.25 mg total) by mouth 2 (two) times daily with a meal. New medication for your blood pressure and your heart. 08/19/13  Yes Rexene Alberts, MD  dexamethasone (DECADRON) 4 MG tablet Take  4 mg by mouth 3 (three) times daily.   Yes Historical Provider, MD  ondansetron (ZOFRAN-ODT) 4 MG disintegrating tablet Take 4 mg by mouth every 8 (eight) hours as needed for nausea or vomiting.   Yes Historical Provider, MD  oxybutynin (DITROPAN) 5 MG tablet Take 1 tablet (5 mg total) by mouth daily. 05/29/13  Yes Kathyrn Drown, MD  pantoprazole (PROTONIX) 40 MG tablet Take 1 tablet (40 mg total) by mouth daily. Medication to treat gastric acid reflux. 08/19/13  Yes Rexene Alberts, MD  prochlorperazine (COMPAZINE) 10 MG tablet Take 10 mg by mouth every 6 (six) hours as needed for nausea or vomiting.  08/15/13  Yes Historical Provider, MD  ALPRAZolam Duanne Moron) 0.5 MG tablet Take 0.5 mg by mouth 2 (two) times daily as needed for anxiety.     Historical Provider, MD   furosemide (LASIX) 40 MG tablet Take 40 mg by mouth daily as needed for fluid.     Historical Provider, MD    Allergies:   Allergies  Allergen Reactions  . Amoxil [Amoxicillin]     RASH  . Cefzil [Cefprozil]     unknown  . Levaquin [Levofloxacin In D5w] Rash and Other (See Comments)    Developed rash after 2 rounds of IV and PO meds. Family unsure if this is the actual culprit of the rash    Social History:  reports that he has quit smoking. His smoking use included Cigarettes. He smoked 0.00 packs per day. He quit smokeless tobacco use about 60 years ago. He reports that he does not drink alcohol or use illicit drugs.  Family History: Family History  Problem Relation Age of Onset  . Stroke Mother   . Hypertension Mother   . Alzheimer's disease Father   . Skin cancer Sister   . Cancer Daughter     Physical Exam: Filed Vitals:   10/04/2013 1721 09/18/2013 2026  BP: 125/97 118/76  Pulse: 133 130  Temp: 97.7 F (36.5 C) 99.5 F (37.5 C)  TempSrc: Oral Rectal  Resp: 18 20  Weight: 77.111 kg (170 lb)   SpO2: 95% 91%   General appearance: alert, cooperative and mild distress  Chronically ill appearing, skin with excuriations all over Head: Normocephalic, without obvious abnormality, atraumatic Eyes: negative Nose: Nares normal. Septum midline. Mucosa normal. No drainage or sinus tenderness. Neck: no JVD and supple, symmetrical, trachea midline Lungs: clear to auscultation bilaterally Heart: regular rate and rhythm, S1, S2 normal, no murmur, click, rub or gallop  tachycardic Abdomen: soft, non-tender; bowel sounds normal; no masses,  no organomegaly  Mild blq abd pain no r/g nonacute abd Extremities: extremities normal, atraumatic, no cyanosis or edema Pulses: 2+ and symmetric Skin: Skin color, texture, turgor normal. No rashes or lesions Neurologic: Grossly normal    Labs on Admission:   Recent Labs  10/01/2013 1821  NA 130*  K 4.9  CL 94*  CO2 27  GLUCOSE 82   BUN 32*  CREATININE 0.79  CALCIUM 8.3*    Recent Labs  09/26/2013 1821  AST 17  ALT 21  ALKPHOS 95  BILITOT 1.6*  PROT 5.9*  ALBUMIN 2.2*    Recent Labs  10/05/2013 1821  WBC 0.3*  HGB 12.9*  HCT 40.2  MCV 86.1  PLT 33*    Radiological Exams on Admission: Ct Abdomen Pelvis W Contrast  09/27/2013   CLINICAL DATA:  Abdominal pain.  EXAM: CT ABDOMEN AND PELVIS WITH CONTRAST  TECHNIQUE: Multidetector CT imaging of the abdomen  and pelvis was performed using the standard protocol following bolus administration of intravenous contrast.  CONTRAST:  123mL OMNIPAQUE IOHEXOL 300 MG/ML  SOLN  COMPARISON:  CT ABD - PELV W/ CM dated 08/18/2013; CT PELVIS W/O & W/CM dated 07/24/2009  FINDINGS: Lung Bases: Large loculated left pleural effusion with enhancing soft tissue implants along the pleural surface which have increased in size compared to the prior exam last month. Index lesion on image number 22 now measures 37 mm, previously 34 mm. The more superior medial implant shows more avid enhancement and is better visualize than on the prior study. This measures 26 mm long axis. Findings suggest pleural carcinomatosis. No spread through the chest wall is identified. Atelectasis of the right base is present. Small amount of pericardial fluid or thickening.  Liver:  Unchanged.  No evidence metastases.  Spleen:  Normal.  Gallbladder:  Surgically absent.  Common bile duct: Postcholecystectomy dilation of the biliary system.  Pancreas: Fatty atrophy of the pancreas. Small periampullary duodenal diverticulum.  Adrenal glands:  Normal bilaterally.  Kidneys: Multiple bilateral renal cysts. Ureters appear within normal limits. Normal enhancement and excretion of contrast. Renal sinus cyst noted.  Stomach:  Grossly normal.  Small bowel:  No obstruction or mesenteric adenopathy.  Colon: There is new stranding along the sigmoid colon with severe underlying diverticulosis. This may represent reactive stranding associated  with colitis but given the diverticular disease, diverticulitis is more likely. No perforation or abscess. No discrete mass.  Pelvic Genitourinary: Unchanged massive prostate enlargement impressing on the urinary bladder.  Bones: Lumbar spondylosis and facet arthrosis. Right greater than left hip osteoarthritis. No large destructive osseous lesions.  Vasculature: Atherosclerosis. No aneurysm or acute vascular abnormality.  Body Wall: Normal.  IMPRESSION: 1. New sigmoid colonic inflammatory changes with stranding in the fat. Favored consideration is diverticulitis however this could also represent stranding associated with colitis. Mesenteric infarction or panniculitis less likely. No perforation or abscess. 2. Loculated left pleural effusion with enhancing pleural soft tissue nodules, most suggestive of pleural carcinomatosis. Unchanged small pericardial fluid or thickening. 3. Cholecystectomy.  Renal cysts. 4. Massive enlargement of the prostate gland is unchanged.   Electronically Signed   By: Dereck Ligas [name]   On: 09/17/2013 20:09    Assessment/Plan  80 yo male with stage 4 lung cancer on chemo/rad with acute diverticulitis, neutropenia  Principal Problem:   Acute diverticulitis of intestine-  Place on iv primaxin.  Ivf.  Iv dilaudid and zofran prn.  abd exam benign at this time.    Active Problems:   Atrial fibrillation   Non-small cell lung cancer   Pleural effusion, malignant   Loculated pleural effusion   Sinus tachycardia   Chemotherapy induced neutropenia  DNR confirmed.    DAVID,RACHAL A 10/06/2013, 8:59 PM   Pt hypotensive now, progressively worsening, resp status stable.  Will bolus one more liter ivf, for a total of about 4.5 L, will reck lactic acid level, procalctionin.  Add vancomycin to primaxin.  If hypotension persists and family agreeable, consider pressors.  Pt is no cpr and no intubation, DNR.  Overall very poor prognosis.

## 2013-10-02 NOTE — ED Notes (Signed)
CRITICAL VALUE ALERT  Critical value received:  Wbc 0.3 Date of notification:  09/22/2013  Time of notification:  19:08  Critical value read back: yes  Nurse who received alert:  Rip Harbour, RN   MD notified (1st page):  Dr Roderic Palau  Time of first page:  19:08  MD notified (2nd page):  Time of second page:  Responding MD:  Dr Roderic Palau  Time MD responded:  19:08

## 2013-10-02 NOTE — ED Notes (Signed)
Pt presents to er for further evaluation of diarrhea, generalized weakness, pt has hx of lung cancer that has met to brain, is currently receiving chemotherapy. Pt alert, able to answer questions, skin dry, Dr Roderic Palau at bedside prior to RN, see edp assessment for further,

## 2013-10-02 NOTE — ED Notes (Signed)
Pt has lung cancer with mets to brain,  Having chemo. Having diarrha. , feels weak.    Taking lomotil.  For diarrhea.

## 2013-10-02 NOTE — ED Provider Notes (Signed)
CSN: [id number]     Arrival date & time 09/19/2013  1714 History   First MD Initiated Contact with Patient 09/25/2013 1736     Chief Complaint  Patient presents with  . Lung Cancer     (Consider location/radiation/quality/duration/timing/severity/associated sxs/prior Treatment) Patient is a 80 y.o. male presenting with diarrhea. The history is provided by a friend (the pt has lung cancer with brain mets.  he is a DNR.  he started with diarhea sunday and has nauseau with rlq pain now.  some nauseau).  Diarrhea Quality:  Watery Severity:  Moderate Onset quality:  Sudden Timing:  Constant Relieved by:  Nothing Associated symptoms: abdominal pain   Associated symptoms: no headaches     Past Medical History  Diagnosis Date  . Hypertension   . Arteriosclerotic cardiovascular disease (ASCVD)     Echocardiogram in 2008: Mild LVH with normal EF; cardiac cath in 09/2005-nonobstructive coronary disease with 50% LAD  . Nonsustained ventricular tachycardia   . Carotid bruit     Right; Carotid ultrasound in 08/2006: scattered atherosclerotic plaque without significant focal disease  . Paroxysmal atrial flutter     Apical left ventricular thrombus and 01/2011  . Chronic anticoagulation   . Abdominal wall hematoma     Admitted 01/2011; supratherapeutic INR  . Cardiomyopathy     EF of 20-25 % 06/2013 and 30% in 01/2011  . Upper GI bleed   . BPH (benign prostatic hypertrophy) 06/14/2011  . Hx of radiation therapy 03/04/04- 04/22/04    left parotid bed, upper jugular chain 45 Gy, 25 fx, parotid bed boosted 63 Gy 9 fx  . Squamous cell carcinoma 2005    left face, left parotid gland  . History of radiation therapy 09/16/05- 11/05/05    central mediastinum, chest  . Carcinoma, lung 2007    Left upper lobe; [place], stage III; treated with RT and chemotherapy  . Multiple Skin cancer 06/14/2011  . PUD (peptic ulcer disease)   . Melanoma     chest  . 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  Atrial fibrillation   . Non-small cell lung cancer     Left upper lobe; treated with concomitant radiation and chemotherapy, then post radiation chemotherapy finishing all as of 04/25/2006 without recurrence.   . Pancreatic insufficiency 08/19/2013   Past Surgical History  Procedure Laterality Date  . Inguinal hernia repair    . Cholecystectomy    . Laparoscopic gastrotomy w/ repair of ulcer    . Skin lesion    . Skin biopsy    . Mohs surgery  05/01/2013    melanoma of chest  . 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 Skin graft Left 11/2003    left face  . Neck dissection Left     resection of squamous cell carcinoma-skin  . Lung biopsy    . Thoracentesis    . Sqamous cell     Family History  Problem Relation Age of Onset  . Stroke Mother   . Hypertension Mother   . Alzheimer's disease Father   . Skin cancer Sister   . Cancer Daughter    History  Substance Use Topics  . Smoking status: Former Smoker    Types: Cigarettes  . Smokeless tobacco: Former Systems developer    Quit date: 05/29/1953  . Alcohol Use: No    Review of Systems  Constitutional: Positive for fatigue. Negative for appetite change.  HENT: Negative for  congestion, ear discharge and sinus pressure.   Eyes: Negative for discharge.  Respiratory: Negative for cough.   Cardiovascular: Negative for chest pain.  Gastrointestinal: Positive for abdominal pain and diarrhea.  Genitourinary: Negative for frequency and hematuria.  Musculoskeletal: Negative for back pain.  Skin: Negative for rash.  Neurological: Negative for seizures and headaches.  Psychiatric/Behavioral: Negative for hallucinations.      Allergies  Amoxil; Cefzil; and Levaquin  Home Medications   Current Outpatient Rx  Name  Route  Sig  Dispense  Refill  . acetaminophen (TYLENOL) 500 MG tablet   Oral   Take 500 mg by mouth every 6 (six) hours as needed for mild pain.          Marland Kitchen aspirin EC 81 MG tablet   Oral   Take 81 mg by mouth  daily.         . carvedilol (COREG) 6.25 MG tablet   Oral   Take 1 tablet (6.25 mg total) by mouth 2 (two) times daily with a meal. New medication for your blood pressure and your heart.   60 tablet   6   . dexamethasone (DECADRON) 4 MG tablet   Oral   Take 4 mg by mouth 3 (three) times daily.         . ondansetron (ZOFRAN-ODT) 4 MG disintegrating tablet   Oral   Take 4 mg by mouth every 8 (eight) hours as needed for nausea or vomiting.         Marland Kitchen oxybutynin (DITROPAN) 5 MG tablet   Oral   Take 1 tablet (5 mg total) by mouth daily.   30 tablet   6   . pantoprazole (PROTONIX) 40 MG tablet   Oral   Take 1 tablet (40 mg total) by mouth daily. Medication to treat gastric acid reflux.   30 tablet   6   . prochlorperazine (COMPAZINE) 10 MG tablet   Oral   Take 10 mg by mouth every 6 (six) hours as needed for nausea or vomiting.          Marland Kitchen ALPRAZolam (XANAX) 0.5 MG tablet   Oral   Take 0.5 mg by mouth 2 (two) times daily as needed for anxiety.          . furosemide (LASIX) 40 MG tablet   Oral   Take 40 mg by mouth daily as needed for fluid.           BP 118/76  Pulse 130  Temp(Src) 99.5 F (37.5 C) (Rectal)  Resp 20  Wt 170 lb (77.111 kg)  SpO2 91% Physical Exam  Constitutional: He is oriented to person, place, and time. He appears well-developed.  HENT:  Head: Normocephalic.  Mucous membranes dry  Eyes: Conjunctivae and EOM are normal. No scleral icterus.  Neck: Neck supple. No thyromegaly present.  Cardiovascular: Regular rhythm.  Exam reveals no gallop and no friction rub.   No murmur heard. Rapid rate  Pulmonary/Chest: No stridor. He has no wheezes. He has no rales. He exhibits no tenderness.  Abdominal: He exhibits no distension. There is tenderness. There is no rebound.  Mild rlq tenderness  Musculoskeletal: Normal range of motion. He exhibits no edema.  Lymphadenopathy:    He has no cervical adenopathy.  Neurological: He is oriented to  person, place, and time. He exhibits normal muscle tone. Coordination normal.  Skin: No rash noted. No erythema.  Psychiatric: He has a normal mood and affect. His behavior is normal.  ED Course  Procedures (including critical care time) Labs Review Labs Reviewed  CBC WITH DIFFERENTIAL - Abnormal; Notable for the following:    WBC 0.3 (*)    Hemoglobin 12.9 (*)    RDW 21.0 (*)    Platelets 33 (*)    All other components within normal limits  COMPREHENSIVE METABOLIC PANEL - Abnormal; Notable for the following:    Sodium 130 (*)    Chloride 94 (*)    BUN 32 (*)    Calcium 8.3 (*)    Total Protein 5.9 (*)    Albumin 2.2 (*)    Total Bilirubin 1.6 (*)    GFR calc non Af Amer 83 (*)    All other components within normal limits  CLOSTRIDIUM DIFFICILE BY PCR  CULTURE, BLOOD (ROUTINE X 2)  CULTURE, BLOOD (ROUTINE X 2)  LACTIC ACID, PLASMA  URINALYSIS, ROUTINE W REFLEX MICROSCOPIC   Imaging Review Ct Abdomen Pelvis W Contrast  09/20/2013   CLINICAL DATA:  Abdominal pain.  EXAM: CT ABDOMEN AND PELVIS WITH CONTRAST  TECHNIQUE: Multidetector CT imaging of the abdomen and pelvis was performed using the standard protocol following bolus administration of intravenous contrast.  CONTRAST:  167mL OMNIPAQUE IOHEXOL 300 MG/ML  SOLN  COMPARISON:  CT ABD - PELV W/ CM dated 08/18/2013; CT PELVIS W/O & W/CM dated 07/24/2009  FINDINGS: Lung Bases: Large loculated left pleural effusion with enhancing soft tissue implants along the pleural surface which have increased in size compared to the prior exam last month. Index lesion on image number 22 now measures 37 mm, previously 34 mm. The more superior medial implant shows more avid enhancement and is better visualize than on the prior study. This measures 26 mm long axis. Findings suggest pleural carcinomatosis. No spread through the chest wall is identified. Atelectasis of the right base is present. Small amount of pericardial fluid or thickening.  Liver:   Unchanged.  No evidence metastases.  Spleen:  Normal.  Gallbladder:  Surgically absent.  Common bile duct: Postcholecystectomy dilation of the biliary system.  Pancreas: Fatty atrophy of the pancreas. Small periampullary duodenal diverticulum.  Adrenal glands:  Normal bilaterally.  Kidneys: Multiple bilateral renal cysts. Ureters appear within normal limits. Normal enhancement and excretion of contrast. Renal sinus cyst noted.  Stomach:  Grossly normal.  Small bowel:  No obstruction or mesenteric adenopathy.  Colon: There is new stranding along the sigmoid colon with severe underlying diverticulosis. This may represent reactive stranding associated with colitis but given the diverticular disease, diverticulitis is more likely. No perforation or abscess. No discrete mass.  Pelvic Genitourinary: Unchanged massive prostate enlargement impressing on the urinary bladder.  Bones: Lumbar spondylosis and facet arthrosis. Right greater than left hip osteoarthritis. No large destructive osseous lesions.  Vasculature: Atherosclerosis. No aneurysm or acute vascular abnormality.  Body Wall: Normal.  IMPRESSION: 1. New sigmoid colonic inflammatory changes with stranding in the fat. Favored consideration is diverticulitis however this could also represent stranding associated with colitis. Mesenteric infarction or panniculitis less likely. No perforation or abscess. 2. Loculated left pleural effusion with enhancing pleural soft tissue nodules, most suggestive of pleural carcinomatosis. Unchanged small pericardial fluid or thickening. 3. Cholecystectomy.  Renal cysts. 4. Massive enlargement of the prostate gland is unchanged.   Electronically Signed   By: Dereck Ligas [name]   On: 10/04/2013 20:09    EKG Interpretation   None      Date: 10/04/2013  Rate: 134  Rhythm: sinus tachycardia  QRS Axis: normal  Intervals: normal  ST/T Wave abnormalities: nonspecific ST changes  Conduction Disutrbances:none  Narrative  Interpretation:   Old EKG Reviewed:none CRITICAL CARE Performed by: Sheree Lalla L Total critical care time: 35 Critical care time was exclusive of separately billable procedures and treating other patients. Critical care was necessary to treat or prevent imminent or life-threatening deterioration. Critical care was time spent personally by me on the following activities: development of treatment plan with patient and/or surrogate as well as nursing, discussions with consultants, evaluation of patient's response to treatment, examination of patient, obtaining history from patient or surrogate, ordering and performing treatments and interventions, ordering and review of laboratory studies, ordering and review of radiographic studies, pulse oximetry and re-evaluation of patient's condition.   I spoke with the oncologist on call for his md dr. Josetta Huddle and he stated to give the pt imipenem and supportive care MDM   Final diagnoses:  None        Maudry Diego, MD 09/15/2013 [phone]

## 2013-10-03 DIAGNOSIS — R7881 Bacteremia: Secondary | ICD-10-CM | POA: Diagnosis present

## 2013-10-03 DIAGNOSIS — D696 Thrombocytopenia, unspecified: Secondary | ICD-10-CM | POA: Diagnosis present

## 2013-10-03 DIAGNOSIS — C4490 Unspecified malignant neoplasm of skin, unspecified: Secondary | ICD-10-CM

## 2013-10-03 DIAGNOSIS — I4891 Unspecified atrial fibrillation: Secondary | ICD-10-CM

## 2013-10-03 DIAGNOSIS — C439 Malignant melanoma of skin, unspecified: Secondary | ICD-10-CM

## 2013-10-03 DIAGNOSIS — Z85820 Personal history of malignant melanoma of skin: Secondary | ICD-10-CM

## 2013-10-03 DIAGNOSIS — J910 Malignant pleural effusion: Secondary | ICD-10-CM

## 2013-10-03 DIAGNOSIS — K5289 Other specified noninfective gastroenteritis and colitis: Secondary | ICD-10-CM

## 2013-10-03 LAB — URINALYSIS, ROUTINE W REFLEX MICROSCOPIC
Glucose, UA: NEGATIVE mg/dL
LEUKOCYTES UA: NEGATIVE
Nitrite: NEGATIVE
Protein, ur: 30 mg/dL — AB
SPECIFIC GRAVITY, URINE: 1.015 (ref 1.005–1.030)
UROBILINOGEN UA: 1 mg/dL (ref 0.0–1.0)
pH: 5.5 (ref 5.0–8.0)

## 2013-10-03 LAB — URINE MICROSCOPIC-ADD ON

## 2013-10-03 LAB — LACTIC ACID, PLASMA: Lactic Acid, Venous: 1.5 mmol/L (ref 0.5–2.2)

## 2013-10-03 LAB — CBC
HCT: 37.6 % — ABNORMAL LOW (ref 39.0–52.0)
HEMOGLOBIN: 12.1 g/dL — AB (ref 13.0–17.0)
MCH: 27.9 pg (ref 26.0–34.0)
MCHC: 32.2 g/dL (ref 30.0–36.0)
MCV: 86.8 fL (ref 78.0–100.0)
PLATELETS: 27 10*3/uL — AB (ref 150–400)
RBC: 4.33 MIL/uL (ref 4.22–5.81)
RDW: 21.2 % — ABNORMAL HIGH (ref 11.5–15.5)
WBC: 0.5 10*3/uL — AB (ref 4.0–10.5)

## 2013-10-03 LAB — BASIC METABOLIC PANEL
BUN: 34 mg/dL — ABNORMAL HIGH (ref 6–23)
CALCIUM: 7.5 mg/dL — AB (ref 8.4–10.5)
CO2: 24 meq/L (ref 19–32)
Chloride: 101 mEq/L (ref 96–112)
Creatinine, Ser: 0.86 mg/dL (ref 0.50–1.35)
GFR calc Af Amer: >90 mL/min (ref >90)
GFR calc non Af Amer: 80 mL/min — ABNORMAL LOW (ref >90)
Glucose, Bld: 52 mg/dL — ABNORMAL LOW (ref 70–99)
Potassium: 4.5 mEq/L (ref 3.7–5.3)
SODIUM: 135 meq/L — AB (ref 137–147)

## 2013-10-03 LAB — MRSA PCR SCREENING: MRSA by PCR: NEGATIVE

## 2013-10-03 LAB — PROCALCITONIN: PROCALCITONIN: 37.76 ng/mL

## 2013-10-03 MED ORDER — MEGESTROL ACETATE 400 MG/10ML PO SUSP
800.0000 mg | Freq: Every day | ORAL | Status: DC
  Administered 2013-10-03: 400 mg via ORAL
  Filled 2013-10-03: qty 20

## 2013-10-03 MED ORDER — VANCOMYCIN HCL IN DEXTROSE 750-5 MG/150ML-% IV SOLN
750.0000 mg | Freq: Once | INTRAVENOUS | Status: DC
  Filled 2013-10-03: qty 150

## 2013-10-03 MED ORDER — SODIUM CHLORIDE 0.9 % IV BOLUS (SEPSIS)
1000.0000 mL | Freq: Once | INTRAVENOUS | Status: AC
  Administered 2013-10-03: 1000 mL via INTRAVENOUS

## 2013-10-03 MED ORDER — VANCOMYCIN HCL 10 G IV SOLR
1500.0000 mg | Freq: Once | INTRAVENOUS | Status: AC
  Administered 2013-10-03: 1500 mg via INTRAVENOUS
  Filled 2013-10-03: qty 1500

## 2013-10-03 MED ORDER — BACITRACIN 500 UNIT/GM EX OINT
TOPICAL_OINTMENT | Freq: Two times a day (BID) | CUTANEOUS | Status: DC
  Administered 2013-10-04: 1 via TOPICAL
  Filled 2013-10-03 (×2): qty 0.9
  Filled 2013-10-03: qty 28.35
  Filled 2013-10-03: qty 0.9

## 2013-10-03 MED ORDER — VANCOMYCIN HCL IN DEXTROSE 1-5 GM/200ML-% IV SOLN
1000.0000 mg | Freq: Two times a day (BID) | INTRAVENOUS | Status: DC
  Administered 2013-10-03 – 2013-10-05 (×4): 1000 mg via INTRAVENOUS
  Filled 2013-10-03 (×4): qty 200

## 2013-10-03 NOTE — Progress Notes (Signed)
Inpatient Diabetes Program Recommendations  AACE/ADA: New Consensus Statement on Inpatient Glycemic Control (2013)  Target Ranges:  Prepandial:   less than 140 mg/dL      Peak postprandial:   less than 180 mg/dL (1-2 hours)      Critically ill patients:  140 - 180 mg/dL   Results for BURGESS, SHERIFF (MRN [id number]) as of 10/03/2013 08:30  Ref. Range 09/21/2013 18:21 10/03/2013 04:13  Glucose Latest Range: 70-99 mg/dL 82 52 (L)   Diabetes history: NO Outpatient Diabetes medications: NA Current orders for Inpatient glycemic control: None  Inpatient Diabetes Program Recommendations IV fluids: Noted lab glucose of 52 mg/dl this morning.  May want to consider adding dextrose to IV fluids if appropriate.  Thanks, Barnie Alderman, RN, MSN, CCRN Diabetes Coordinator Inpatient Diabetes Program [phone] (Team Pager) [phone] ([facility] office) [phone] [facility] office)

## 2013-10-03 NOTE — Progress Notes (Addendum)
[facility] PROGRESS NOTE  Stephen Martin QVZ:[id number] DOB: 1931/08/05 DOA: 09/19/2013 PCP: [name], MD  Assessment/Plan:  Acute diverticulitis of intestine-  -Place on iv primaxin. Ivf. Iv dilaudid and zofran prn.  -Pain in the left lower quadrant abdomen  Metastatic non-small cell lung cancer(NSCLC) -Patient has metastasis to the brain, peritoneal wall, and has a malignant pleural effusion left side. -November 2014 patient had a thoracentesis with pleural biopsy confirming metastasis/malignant pleural effusion. Fax was declined at that time secondary to cardiothoracic surgery believing patient would not survive procedure -Counseled patient and wife that the effusion was now loculated and procedure choice would be a chest tube or VATS procedure both of which would be high risk. Requested that I speak with RN Learta Codding [phone] they have designated as Financial planner. -RN Kathyrn Drown confirm that they have refused hospice in the past. -Left message with Dr. Harvest Forest (oncologist) [phone] requesting that he contact me in order to discuss short-term/long-term care goals for patient   Malignant melanoma -See Metastatic NSCLC  Chemotherapy induced neutropenia -Institute neutropenic precautions -Follow closely all cell lines -Continue antibiotics  Thrombocytopenia -Would transfuse if drops below 10,000 or active bleeding  Left loculated pleural effusion -See metastatic NSCLC   Cardiomyopathy -Stable -Obtain proBNP   Atrial fibrillation -Currently in NSR -Continue Coreg  Gram-positive cocci in chain bacteremia -Patient currently on vancomycin and Primaxin which should cover organisms. Await speciation and sensitivities     Code Status: DO NOT RESUSCITATE  Family Communication:  Disposition Plan:    Consultants:    Procedures: CT abdomen pelvis with contrast 09/27/2013 1. New sigmoid colonic inflammatory changes with stranding in the  fat.  Favored consideration is diverticulitis however this could also  represent stranding associated with colitis. Mesenteric infarction  or panniculitis less likely. No perforation or abscess.  2. Loculated left pleural effusion with enhancing pleural soft  tissue nodules, most suggestive of pleural carcinomatosis. Unchanged  small pericardial fluid or thickening.  3. Cholecystectomy. Renal cysts.  4. Massive enlargement of the prostate gland is unchanged.   Echocardiogram 06/28/2013 - Left ventricle: The cavity size was mildly dilated. Wall  thickness was normal.  -LVEF=  20% to 25%. Diffuse hypokinesis. - Pericardium, extracardiac: A trivial pericardial effusion was identified.      Antibiotics: Primaxin 2/25>> Vancomycin 2/25>>    HPI/Subjective: [name] PMHx chronic systolic CHF, atrial fibrillation non-small cell lung cancer left upper lobe S/P radiation and chemotherapy initial treatment completed 04/25/2006, 1/21 2015 patient was scheduled to start brain radiation therapy secondary to metastatic disease. It was also recommended at that time patient had an ultrasound guided left thoracentesis for recurrent left pleural effusion Dr. Tharon Aquas Trigt (cardiothoracic surgeon), S/P left pleural biopsy 07/30/2013 which showed metastatic high grade carcinoma, sarcomatoid type,, melanoma, pancreatic insufficiency. Presented to ED comes in with lower abd pain and diarrhea for over 24 hours. Has also had some n/v nonbloody. He feels awful. Diarrhea nonbloody. No fevers at home. Ct shows prob acute diverticlulitis. Has not been doing well with the chemo and also getting radiation to brain mets. Patient was started on Primaxin and vancomycin 2/25 RN Learta Codding [phone] verified that patient had received brain radiation 06/2013, and had received Taxol+ carboplatin chemotherapy last week   Objective: Filed Vitals:   10/03/13 0600 10/03/13 0630 10/03/13 0700 10/03/13 0730  BP: 73/48 107/60  81/50 86/50  Pulse: 130 144 130 126  Temp:      TempSrc:      Resp: 18 25  19 18  Height:      Weight:      SpO2: 96% 96% 97% 97%    Intake/Output Summary (Last 24 hours) at 10/03/13 0756 Last data filed at 10/03/13 0606  Gross per 24 hour  Intake 3650.83 ml  Output      0 ml  Net 3650.83 ml   Filed Weights   09/16/2013 1721 09/23/2013 2330  Weight: 77.111 kg (170 lb) 72.5 kg (159 lb 13.3 oz)    Exam:   General: A./O. x4, NAD, lethargic but arousable  Cardiovascular: Tachycardic, regular rhythm, negative murmurs rubs or gallops  Respiratory: No breath sounds heard left lower lung/lingula, course left upper lobe breath sounds  Abdomen: Soft, nontender, nondistended, plus bowel sounds  Musculoskeletal: Cachectic, positive pedal edema 2+ to mid calf bilateral   Data Reviewed: Basic Metabolic Panel:  Recent Labs Lab 09/29/2013 1821 10/03/13 0413  NA 130* 135*  K 4.9 4.5  CL 94* 101  CO2 27 24  GLUCOSE 82 52*  BUN 32* 34*  CREATININE 0.79 0.86  CALCIUM 8.3* 7.5*   Liver Function Tests:  Recent Labs Lab 09/20/2013 1821  AST 17  ALT 21  ALKPHOS 95  BILITOT 1.6*  PROT 5.9*  ALBUMIN 2.2*   No results found for this basename: LIPASE, AMYLASE,  in the last 168 hours No results found for this basename: AMMONIA,  in the last 168 hours CBC:  Recent Labs Lab 09/23/2013 1821 10/03/13 0413  WBC 0.3* 0.5*  HGB 12.9* 12.1*  HCT 40.2 37.6*  MCV 86.1 86.8  PLT 33* 27*   Cardiac Enzymes: No results found for this basename: CKTOTAL, CKMB, CKMBINDEX, TROPONINI,  in the last 168 hours BNP (last 3 results) No results found for this basename: PROBNP,  in the last 8760 hours CBG: No results found for this basename: GLUCAP,  in the last 168 hours  Recent Results (from the past 240 hour(s))  CULTURE, BLOOD (ROUTINE X 2)     Status: None   Collection Time    10/05/2013  8:41 PM      Result Value Ref Range Status   Specimen Description BLOOD PICC   Final   Special  Requests BOTTLES DRAWN AEROBIC AND ANAEROBIC 5CC EACH   Final   Culture PENDING   Incomplete   Report Status PENDING   Incomplete  CULTURE, BLOOD (ROUTINE X 2)     Status: None   Collection Time    09/09/2013  8:46 PM      Result Value Ref Range Status   Specimen Description BLOOD LEFT WRIST   Final   Special Requests BOTTLES DRAWN AEROBIC AND ANAEROBIC 4CC EACH   Final   Culture PENDING   Incomplete   Report Status PENDING   Incomplete  MRSA PCR SCREENING     Status: None   Collection Time    09/17/2013 10:40 PM      Result Value Ref Range Status   MRSA by PCR NEGATIVE  NEGATIVE Final   Comment:            The GeneXpert MRSA Assay (FDA     approved for NASAL specimens     only), is one component of a     comprehensive MRSA colonization     surveillance program. It is not     intended to diagnose MRSA     infection nor to guide or     monitor treatment for     MRSA infections.  Studies: Ct Abdomen Pelvis W Contrast  10/04/2013   CLINICAL DATA:  Abdominal pain.  EXAM: CT ABDOMEN AND PELVIS WITH CONTRAST  TECHNIQUE: Multidetector CT imaging of the abdomen and pelvis was performed using the standard protocol following bolus administration of intravenous contrast.  CONTRAST:  129mL OMNIPAQUE IOHEXOL 300 MG/ML  SOLN  COMPARISON:  CT ABD - PELV W/ CM dated 08/18/2013; CT PELVIS W/O & W/CM dated 07/24/2009  FINDINGS: Lung Bases: Large loculated left pleural effusion with enhancing soft tissue implants along the pleural surface which have increased in size compared to the prior exam last month. Index lesion on image number 22 now measures 37 mm, previously 34 mm. The more superior medial implant shows more avid enhancement and is better visualize than on the prior study. This measures 26 mm long axis. Findings suggest pleural carcinomatosis. No spread through the chest wall is identified. Atelectasis of the right base is present. Small amount of pericardial fluid or thickening.  Liver:   Unchanged.  No evidence metastases.  Spleen:  Normal.  Gallbladder:  Surgically absent.  Common bile duct: Postcholecystectomy dilation of the biliary system.  Pancreas: Fatty atrophy of the pancreas. Small periampullary duodenal diverticulum.  Adrenal glands:  Normal bilaterally.  Kidneys: Multiple bilateral renal cysts. Ureters appear within normal limits. Normal enhancement and excretion of contrast. Renal sinus cyst noted.  Stomach:  Grossly normal.  Small bowel:  No obstruction or mesenteric adenopathy.  Colon: There is new stranding along the sigmoid colon with severe underlying diverticulosis. This may represent reactive stranding associated with colitis but given the diverticular disease, diverticulitis is more likely. No perforation or abscess. No discrete mass.  Pelvic Genitourinary: Unchanged massive prostate enlargement impressing on the urinary bladder.  Bones: Lumbar spondylosis and facet arthrosis. Right greater than left hip osteoarthritis. No large destructive osseous lesions.  Vasculature: Atherosclerosis. No aneurysm or acute vascular abnormality.  Body Wall: Normal.  IMPRESSION: 1. New sigmoid colonic inflammatory changes with stranding in the fat. Favored consideration is diverticulitis however this could also represent stranding associated with colitis. Mesenteric infarction or panniculitis less likely. No perforation or abscess. 2. Loculated left pleural effusion with enhancing pleural soft tissue nodules, most suggestive of pleural carcinomatosis. Unchanged small pericardial fluid or thickening. 3. Cholecystectomy.  Renal cysts. 4. Massive enlargement of the prostate gland is unchanged.   Electronically Signed   By: Dereck Ligas [name]   On: 09/28/2013 20:09    Scheduled Meds: . aspirin EC  81 mg Oral Daily  . carvedilol  6.25 mg Oral BID WC  . dexamethasone  4 mg Oral TID  . enoxaparin (LOVENOX) injection  40 mg Subcutaneous Q24H  . imipenem-cilastatin  500 mg Intravenous Q8H  .  oxybutynin  5 mg Oral Daily  . pantoprazole  40 mg Oral Daily  . sodium chloride  1,000 mL Intravenous Once  . vancomycin  1,500 mg Intravenous Once   Continuous Infusions: . sodium chloride Stopped (10/03/13 0019)    Principal Problem:   Acute diverticulitis of intestine Active Problems:   Atrial fibrillation   Non-small cell lung cancer   Pleural effusion, malignant   Loculated pleural effusion   Sinus tachycardia   Chemotherapy induced neutropenia   Colitis    Time spent: 50 minutes    WOODS, Selz, J  [facility] Pager [phone]. If 7PM-7AM, please contact night-coverage at [email or web address], password [email or web address] 10/03/2013, 7:56 AM  LOS: 1 day

## 2013-10-03 NOTE — Progress Notes (Signed)
eLink Physician-Brief Progress Note Patient Name: YURI FANA DOB: [birth year] MRN: [id number]  Date of Service  10/03/2013   HPI/Events of Note   Septic shock Stage IV lung cancer Per RN, family does not want aggressive care given end stage cancer  eICU Interventions  Continue fluid resuscitation per primary service   Intervention Category Major Interventions: Sepsis - evaluation and management  Shayden Bobier 10/03/2013, 4:27 AM

## 2013-10-03 NOTE — Progress Notes (Signed)
Dr. Sherral Hammers notified of patient blood cultures gram + cocci in chains, both sets.

## 2013-10-03 NOTE — Progress Notes (Signed)
ANTIBIOTIC CONSULT NOTE-Preliminary  Pharmacy Consult for Vancomycin Indication: Bacteremia  Allergies  Allergen Reactions  . Amoxil [Amoxicillin]     RASH  . Cefzil [Cefprozil]     unknown  . Levaquin [Levofloxacin In D5w] Rash and Other (See Comments)    Developed rash after 2 rounds of IV and PO meds. Family unsure if this is the actual culprit of the rash    Patient Measurements: Height: 6\' 1"  (185.4 cm) Weight: 159 lb 13.3 oz (72.5 kg) IBW/kg (Calculated) : 79.9  Vital Signs: Temp: 98 F (36.7 C) (02/25 0000) Temp src: Axillary (02/25 0000) BP: 73/48 mmHg (02/25 0600) Pulse Rate: 130 (02/25 0600)  Labs:  Recent Labs  09/20/2013 1821  WBC 0.3*  HGB 12.9*  PLT 33*  CREATININE 0.79    Estimated Creatinine Clearance: 75.5 ml/min (by C-G formula based on Cr of 0.79).  No results found for this basename: VANCOTROUGH, Corlis Leak, VANCORANDOM, [place], GENTPEAK, GENTRANDOM, TOBRATROUGH, TOBRAPEAK, TOBRARND, AMIKACINPEAK, AMIKACINTROU, AMIKACIN,  in the last 72 hours   Microbiology: Recent Results (from the past 720 hour(s))  CULTURE, BLOOD (ROUTINE X 2)     Status: None   Collection Time    09/30/2013  8:41 PM      Result Value Ref Range Status   Specimen Description BLOOD PICC   Final   Special Requests BOTTLES DRAWN AEROBIC AND ANAEROBIC 5CC EACH   Final   Culture PENDING   Incomplete   Report Status PENDING   Incomplete  CULTURE, BLOOD (ROUTINE X 2)     Status: None   Collection Time    09/09/2013  8:46 PM      Result Value Ref Range Status   Specimen Description BLOOD LEFT WRIST   Final   Special Requests BOTTLES DRAWN AEROBIC AND ANAEROBIC 4CC EACH   Final   Culture PENDING   Incomplete   Report Status PENDING   Incomplete  MRSA PCR SCREENING     Status: None   Collection Time    10/05/2013 10:40 PM      Result Value Ref Range Status   MRSA by PCR NEGATIVE  NEGATIVE Final   Comment:            The GeneXpert MRSA Assay (FDA     approved for NASAL  specimens     only), is one component of a     comprehensive MRSA colonization     surveillance program. It is not     intended to diagnose MRSA     infection nor to guide or     monitor treatment for     MRSA infections.    Medical History: Past Medical History  Diagnosis Date  . Hypertension   . Arteriosclerotic cardiovascular disease (ASCVD)     Echocardiogram in 2008: Mild LVH with normal EF; cardiac cath in 09/2005-nonobstructive coronary disease with 50% LAD  . Nonsustained ventricular tachycardia   . Carotid bruit     Right; Carotid ultrasound in 08/2006: scattered atherosclerotic plaque without significant focal disease  . Paroxysmal atrial flutter     Apical left ventricular thrombus and 01/2011  . Chronic anticoagulation   . Abdominal wall hematoma     Admitted 01/2011; supratherapeutic INR  . Cardiomyopathy     EF of 20-25 % 06/2013 and 30% in 01/2011  . Upper GI bleed   . BPH (benign prostatic hypertrophy) 06/14/2011  . Hx of radiation therapy 03/04/04- 04/22/04    left parotid bed, upper jugular chain 45 Gy,  25 fx, parotid bed boosted 63 Gy 9 fx  . Squamous cell carcinoma 2005    left face, left parotid gland  . History of radiation therapy 09/16/05- 11/05/05    central mediastinum, chest  . Carcinoma, lung 2007    Left upper lobe; [place], stage III; treated with RT and chemotherapy  . Multiple Skin cancer 06/14/2011  . PUD (peptic ulcer disease)   . Melanoma     chest  . 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 Atrial fibrillation   . Non-small cell lung cancer     Left upper lobe; treated with concomitant radiation and chemotherapy, then post radiation chemotherapy finishing all as of 04/25/2006 without recurrence.   . Pancreatic insufficiency 08/19/2013    Medications:  Primaxin 500 mg IV every 8 hours  Assessment: 79 yo male with stage 4 lung cancer on chemo/radiation with acute diverticulitis and neutropenia. Pt now with hypotension, worsening  resp status and s/s sepsis.   Goal of Therapy:  Vancomycin troughs 15-20 mcg/ml  Plan:  Preliminary review of pertinent patient information completed.  Protocol will be initiated with a one-time dose of Vancomycin 750 mg IV.  Forestine Na clinical pharmacist will complete review during morning rounds to assess patient and finalize treatment regimen.  Norberto Sorenson, [facility] 10/03/2013,6:25 AM

## 2013-10-03 NOTE — Progress Notes (Signed)
ANTIBIOTIC CONSULT NOTE - FOLLOW UP  Pharmacy Consult for Vancomycin and Primaxin Indication: Bacteremia and intra-abdominal infection  Allergies  Allergen Reactions  . Amoxil [Amoxicillin]     RASH  . Cefzil [Cefprozil]     unknown  . Levaquin [Levofloxacin In D5w] Rash and Other (See Comments)    Developed rash after 2 rounds of IV and PO meds. Family unsure if this is the actual culprit of the rash   Patient Measurements: Height: 6\' 1"  (185.4 cm) Weight: 159 lb 13.3 oz (72.5 kg) IBW/kg (Calculated) : 79.9  Vital Signs: Temp: 99.3 F (37.4 C) (02/25 0400) Temp src: Oral (02/25 0400) BP: 86/50 mmHg (02/25 0730) Pulse Rate: 126 (02/25 0730) Intake/Output from previous day: 02/24 0701 - 02/25 0700 In: 3650.8 [I.V.:1642.5; IV Piggyback:2008.3] Out: -  Intake/Output from this shift:    Labs:  Recent Labs  09/26/2013 1821 10/03/13 0413  WBC 0.3* 0.5*  HGB 12.9* 12.1*  PLT 33* 27*  CREATININE 0.79 0.86   Estimated Creatinine Clearance: 70.3 ml/min (by C-G formula based on Cr of 0.86). No results found for this basename: VANCOTROUGH, Corlis Leak, VANCORANDOM, [place], GENTPEAK, GENTRANDOM, TOBRATROUGH, TOBRAPEAK, TOBRARND, AMIKACINPEAK, AMIKACINTROU, AMIKACIN,  in the last 72 hours   Microbiology: Recent Results (from the past 720 hour(s))  CULTURE, BLOOD (ROUTINE X 2)     Status: None   Collection Time    09/20/2013  8:41 PM      Result Value Ref Range Status   Specimen Description BLOOD PICC   Final   Special Requests BOTTLES DRAWN AEROBIC AND ANAEROBIC 5CC EACH   Final   Culture PENDING   Incomplete   Report Status PENDING   Incomplete  CULTURE, BLOOD (ROUTINE X 2)     Status: None   Collection Time    09/25/2013  8:46 PM      Result Value Ref Range Status   Specimen Description BLOOD LEFT WRIST   Final   Special Requests BOTTLES DRAWN AEROBIC AND ANAEROBIC 4CC EACH   Final   Culture PENDING   Incomplete   Report Status PENDING   Incomplete  MRSA PCR SCREENING      Status: None   Collection Time    09/28/2013 10:40 PM      Result Value Ref Range Status   MRSA by PCR NEGATIVE  NEGATIVE Final   Comment:            The GeneXpert MRSA Assay (FDA     approved for NASAL specimens     only), is one component of a     comprehensive MRSA colonization     surveillance program. It is not     intended to diagnose MRSA     infection nor to guide or     monitor treatment for     MRSA infections.    Anti-infectives   Start     Dose/Rate Route Frequency Ordered Stop   10/03/13 0900  vancomycin (VANCOCIN) 1,500 mg in sodium chloride 0.9 % 500 mL IVPB     1,500 mg 250 mL/hr over 120 Minutes Intravenous  Once 10/03/13 0740     10/03/13 0630  vancomycin (VANCOCIN) IVPB 750 mg/150 ml premix  Status:  Discontinued     750 mg 150 mL/hr over 60 Minutes Intravenous  Once 10/03/13 0624 10/03/13 0739   10/03/13 0500  imipenem-cilastatin (PRIMAXIN) 500 mg in sodium chloride 0.9 % 100 mL IVPB     500 mg 200 mL/hr over 30 Minutes Intravenous Every  8 hours 10/04/2013 2235     10/01/2013 2245  imipenem-cilastatin (PRIMAXIN) 500 mg in sodium chloride 0.9 % 100 mL IVPB  Status:  Discontinued     500 mg 200 mL/hr over 30 Minutes Intravenous 3 times per day 09/21/2013 2231 09/19/2013 2235   09/28/2013 2045  imipenem-cilastatin (PRIMAXIN) 500 mg in sodium chloride 0.9 % 100 mL IVPB     500 mg 200 mL/hr over 30 Minutes Intravenous  Once 09/29/2013 2041 09/12/2013 2146     Assessment: 80yo male with h/o lung cancer with mets to brain and s/p chemo Rx.  Pt presents to ED c/o lower abdominal pain and diarrhea.  Pt is neutropenic and thrombocytopenic.  Currently afebrile.    Estimated Creatinine Clearance: 70.3 ml/min (by C-G formula based on Cr of 0.86).  Goal of Therapy:  Vancomycin trough level 15-20 mcg/ml Eradicate infection.  Plan:  Vancomycin 1500mg  IV today x 1 (loading dose) then Vancomycin 1000mg  IV q12hrs Check trough at steady state Primaxin 500mg  IV q8hrs Monitor labs,  renal fxn, and cultures Duration of therapy per MD  Hart Robinsons A 10/03/2013,8:42 AM

## 2013-10-03 NOTE — Progress Notes (Signed)
INITIAL NUTRITION ASSESSMENT  DOCUMENTATION CODES Per approved criteria  -Not Applicable   INTERVENTION: Will follow for diet advancement and goal of care  NUTRITION DIAGNOSIS: Inadequate oral intake related to altered GI function as evidenced by NPO.   Goal: Pt will meet nutritional needs as able  Monitor:  Diet advancement, PO intake, labs, skin assessments, weight changes, I/O's, goals of care  Reason for Assessment: MST=3  80 y.o. male  Admitting Dx: Acute diverticulitis of intestine  ASSESSMENT: Pt admitted with acute diverticulitis and abdominal pain. He is currently NPO. Pt with hx of stage IV lung cancer with mets to the brain. He is currently undergoing chemotherapy and radiation treatments at [facility], which he has not been tolerating well.  Wt hx reveals a 7.6% wt loss x 6 and 1 months, which is clinically significant for 1 month. Pt also on Lasix at home, so fluid loss may be a contributing factor to weight loss.  Due to stage IV cancer, pt family does not desire any aggressive measures.  Will continue to follow for diet advancement and goals of care.  Pt is at risk for malnutrition given increased nutritional needs for cancer treatments and wound healing.  Height: Ht Readings from Last 1 Encounters:  10/05/2013 6\' 1"  (1.854 m)    Weight: Wt Readings from Last 1 Encounters:  09/29/2013 159 lb 13.3 oz (72.5 kg)    Ideal Body Weight: 184#  % Ideal Body Weight: 86%  Wt Readings from Last 10 Encounters:  09/23/2013 159 lb 13.3 oz (72.5 kg)  08/29/13 172 lb 9.6 oz (78.291 kg)  08/29/13 160 lb (72.576 kg)  08/27/13 168 lb (76.204 kg)  08/23/13 166 lb (75.297 kg)  08/23/13 166 lb (75.297 kg)  08/19/13 166 lb 0.1 oz (75.3 kg)  08/08/13 169 lb 9.6 oz (76.93 kg)  07/31/13 171 lb 2 oz (77.622 kg)  07/30/13 162 lb (73.483 kg)    Usual Body Weight: 190#  % Usual Body Weight: 85%  BMI:  Body mass index is 21.09 kg/(m^2). Meets criteria for normal weight.    Estimated Nutritional Needs: Kcal: 2175-2538 daily Protein: 91-109 grams daily Fluid: 2.2-2.5 L daily  Skin: stage II pressure ulcer on buttocks  Diet Order: NPO  EDUCATION NEEDS: -Education not appropriate at this time   Intake/Output Summary (Last 24 hours) at 10/03/13 1442 Last data filed at 10/03/13 1222  Gross per 24 hour  Intake 3650.83 ml  Output    100 ml  Net 3550.83 ml    Last BM: 10/04/2013   Labs:   Recent Labs Lab 10/05/2013 1821 10/03/13 0413  NA 130* 135*  K 4.9 4.5  CL 94* 101  CO2 27 24  BUN 32* 34*  CREATININE 0.79 0.86  CALCIUM 8.3* 7.5*  GLUCOSE 82 52*    CBG (last 3)  No results found for this basename: GLUCAP,  in the last 72 hours  Scheduled Meds: . aspirin EC  81 mg Oral Daily  . carvedilol  6.25 mg Oral BID WC  . dexamethasone  4 mg Oral TID  . enoxaparin (LOVENOX) injection  40 mg Subcutaneous Q24H  . imipenem-cilastatin  500 mg Intravenous Q8H  . megestrol  800 mg Oral Daily  . oxybutynin  5 mg Oral Daily  . pantoprazole  40 mg Oral Daily  . vancomycin  1,000 mg Intravenous Q12H    Continuous Infusions:   Past Medical History  Diagnosis Date  . Hypertension   . Arteriosclerotic cardiovascular disease (ASCVD)  Echocardiogram in 2008: Mild LVH with normal EF; cardiac cath in 09/2005-nonobstructive coronary disease with 50% LAD  . Nonsustained ventricular tachycardia   . Carotid bruit     Right; Carotid ultrasound in 08/2006: scattered atherosclerotic plaque without significant focal disease  . Paroxysmal atrial flutter     Apical left ventricular thrombus and 01/2011  . Chronic anticoagulation   . Abdominal wall hematoma     Admitted 01/2011; supratherapeutic INR  . Cardiomyopathy     EF of 20-25 % 06/2013 and 30% in 01/2011  . Upper GI bleed   . BPH (benign prostatic hypertrophy) 06/14/2011  . Hx of radiation therapy 03/04/04- 04/22/04    left parotid bed, upper jugular chain 45 Gy, 25 fx, parotid bed boosted 63 Gy 9 fx   . Squamous cell carcinoma 2005    left face, left parotid gland  . History of radiation therapy 09/16/05- 11/05/05    central mediastinum, chest  . Carcinoma, lung 2007    Left upper lobe; [place], stage III; treated with RT and chemotherapy  . Multiple Skin cancer 06/14/2011  . PUD (peptic ulcer disease)   . Melanoma     chest  . 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 Atrial fibrillation   . Non-small cell lung cancer     Left upper lobe; treated with concomitant radiation and chemotherapy, then post radiation chemotherapy finishing all as of 04/25/2006 without recurrence.   . Pancreatic insufficiency 08/19/2013    Past Surgical History  Procedure Laterality Date  . Inguinal hernia repair    . Cholecystectomy    . Laparoscopic gastrotomy w/ repair of ulcer    . Skin lesion    . Skin biopsy    . Mohs surgery  05/01/2013    melanoma of chest  . 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 Skin graft Left 11/2003    left face  . Neck dissection Left     resection of squamous cell carcinoma-skin  . Lung biopsy    . Thoracentesis    . Sqamous cell      Elle Vezina A. Jimmye Norman, RD, LDN Pager: [phone]

## 2013-10-03 NOTE — Progress Notes (Signed)
UR chart review completed.  

## 2013-10-03 NOTE — Progress Notes (Signed)
ANTIBIOTIC CONSULT NOTE-Preliminary  Pharmacy Consult for Vancomycin Indication: Bacteremia  Allergies  Allergen Reactions  . Amoxil [Amoxicillin]     RASH  . Cefzil [Cefprozil]     unknown  . Levaquin [Levofloxacin In D5w] Rash and Other (See Comments)    Developed rash after 2 rounds of IV and PO meds. Family unsure if this is the actual culprit of the rash   Patient Measurements: Height: 6\' 1"  (185.4 cm) Weight: 159 lb 13.3 oz (72.5 kg) IBW/kg (Calculated) : 79.9  Vital Signs: Temp: 99.3 F (37.4 C) (02/25 0400) Temp src: Oral (02/25 0400) BP: 73/48 mmHg (02/25 0600) Pulse Rate: 130 (02/25 0600)  Labs:  Recent Labs  09/30/2013 1821 10/03/13 0413  WBC 0.3* 0.5*  HGB 12.9* 12.1*  PLT 33* 27*  CREATININE 0.79 0.86   Estimated Creatinine Clearance: 70.3 ml/min (by C-G formula based on Cr of 0.86).  No results found for this basename: VANCOTROUGH, Corlis Leak, VANCORANDOM, [place], GENTPEAK, GENTRANDOM, TOBRATROUGH, TOBRAPEAK, TOBRARND, AMIKACINPEAK, AMIKACINTROU, AMIKACIN,  in the last 72 hours   Microbiology: Recent Results (from the past 720 hour(s))  CULTURE, BLOOD (ROUTINE X 2)     Status: None   Collection Time    09/28/2013  8:41 PM      Result Value Ref Range Status   Specimen Description BLOOD PICC   Final   Special Requests BOTTLES DRAWN AEROBIC AND ANAEROBIC 5CC EACH   Final   Culture PENDING   Incomplete   Report Status PENDING   Incomplete  CULTURE, BLOOD (ROUTINE X 2)     Status: None   Collection Time    10/06/2013  8:46 PM      Result Value Ref Range Status   Specimen Description BLOOD LEFT WRIST   Final   Special Requests BOTTLES DRAWN AEROBIC AND ANAEROBIC 4CC EACH   Final   Culture PENDING   Incomplete   Report Status PENDING   Incomplete  MRSA PCR SCREENING     Status: None   Collection Time    09/18/2013 10:40 PM      Result Value Ref Range Status   MRSA by PCR NEGATIVE  NEGATIVE Final   Comment:            The GeneXpert MRSA Assay (FDA    approved for NASAL specimens     only), is one component of a     comprehensive MRSA colonization     surveillance program. It is not     intended to diagnose MRSA     infection nor to guide or     monitor treatment for     MRSA infections.   Medical History: Past Medical History  Diagnosis Date  . Hypertension   . Arteriosclerotic cardiovascular disease (ASCVD)     Echocardiogram in 2008: Mild LVH with normal EF; cardiac cath in 09/2005-nonobstructive coronary disease with 50% LAD  . Nonsustained ventricular tachycardia   . Carotid bruit     Right; Carotid ultrasound in 08/2006: scattered atherosclerotic plaque without significant focal disease  . Paroxysmal atrial flutter     Apical left ventricular thrombus and 01/2011  . Chronic anticoagulation   . Abdominal wall hematoma     Admitted 01/2011; supratherapeutic INR  . Cardiomyopathy     EF of 20-25 % 06/2013 and 30% in 01/2011  . Upper GI bleed   . BPH (benign prostatic hypertrophy) 06/14/2011  . Hx of radiation therapy 03/04/04- 04/22/04    left parotid bed, upper jugular chain 45  Gy, 25 fx, parotid bed boosted 63 Gy 9 fx  . Squamous cell carcinoma 2005    left face, left parotid gland  . History of radiation therapy 09/16/05- 11/05/05    central mediastinum, chest  . Carcinoma, lung 2007    Left upper lobe; [place], stage III; treated with RT and chemotherapy  . Multiple Skin cancer 06/14/2011  . PUD (peptic ulcer disease)   . Melanoma     chest  . 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 Atrial fibrillation   . Non-small cell lung cancer     Left upper lobe; treated with concomitant radiation and chemotherapy, then post radiation chemotherapy finishing all as of 04/25/2006 without recurrence.   . Pancreatic insufficiency 08/19/2013   Medications:  Primaxin 500 mg IV every 8 hours  Assessment: 80 yo male with stage 4 lung cancer on chemo/radiation with acute diverticulitis and neutropenia. Pt now with  hypotension, worsening resp status and s/s sepsis.  Goal of Therapy:  Vancomycin troughs 15-20 mcg/ml  Plan:  Preliminary review of pertinent patient information completed.  Protocol will be initiated with a one-time dose of Vancomycin 1500 mg IV.  Forestine Na clinical pharmacist will complete review during morning rounds to assess patient and finalize treatment regimen.  Hart Robinsons A, RPH 10/03/2013,7:41 AM

## 2013-10-04 DIAGNOSIS — D696 Thrombocytopenia, unspecified: Secondary | ICD-10-CM

## 2013-10-04 DIAGNOSIS — B9689 Other specified bacterial agents as the cause of diseases classified elsewhere: Secondary | ICD-10-CM

## 2013-10-04 DIAGNOSIS — Z923 Personal history of irradiation: Secondary | ICD-10-CM

## 2013-10-04 DIAGNOSIS — R7881 Bacteremia: Secondary | ICD-10-CM

## 2013-10-04 LAB — COMPREHENSIVE METABOLIC PANEL
ALBUMIN: 1.6 g/dL — AB (ref 3.5–5.2)
ALK PHOS: 96 U/L (ref 39–117)
ALT: 17 U/L (ref 0–53)
AST: 13 U/L (ref 0–37)
BILIRUBIN TOTAL: 0.8 mg/dL (ref 0.3–1.2)
BUN: 37 mg/dL — AB (ref 6–23)
CHLORIDE: 101 meq/L (ref 96–112)
CO2: 22 mEq/L (ref 19–32)
Calcium: 8.2 mg/dL — ABNORMAL LOW (ref 8.4–10.5)
Creatinine, Ser: 0.85 mg/dL (ref 0.50–1.35)
GFR calc Af Amer: >90 mL/min (ref >90)
GFR calc non Af Amer: 80 mL/min — ABNORMAL LOW (ref >90)
Glucose, Bld: 70 mg/dL (ref 70–99)
POTASSIUM: 4.6 meq/L (ref 3.7–5.3)
Sodium: 136 mEq/L — ABNORMAL LOW (ref 137–147)
TOTAL PROTEIN: 5.3 g/dL — AB (ref 6.0–8.3)

## 2013-10-04 LAB — CBC WITH DIFFERENTIAL/PLATELET
Basophils Absolute: 0 10*3/uL (ref 0.0–0.1)
Basophils Relative: 0 % (ref 0–1)
EOS ABS: 0 10*3/uL (ref 0.0–0.7)
EOS PCT: 0 % (ref 0–5)
HEMATOCRIT: 34.3 % — AB (ref 39.0–52.0)
Hemoglobin: 11.2 g/dL — ABNORMAL LOW (ref 13.0–17.0)
LYMPHS ABS: 0.1 10*3/uL — AB (ref 0.7–4.0)
Lymphocytes Relative: 3 % — ABNORMAL LOW (ref 12–46)
MCH: 28.1 pg (ref 26.0–34.0)
MCHC: 32.7 g/dL (ref 30.0–36.0)
MCV: 86.2 fL (ref 78.0–100.0)
MONO ABS: 0 10*3/uL — AB (ref 0.1–1.0)
Monocytes Relative: 1 % — ABNORMAL LOW (ref 3–12)
Neutro Abs: 4.4 10*3/uL (ref 1.7–7.7)
Neutrophils Relative %: 96 % — ABNORMAL HIGH (ref 43–77)
Platelets: 35 10*3/uL — ABNORMAL LOW (ref 150–400)
RBC: 3.98 MIL/uL — ABNORMAL LOW (ref 4.22–5.81)
RDW: 21.7 % — ABNORMAL HIGH (ref 11.5–15.5)
Smear Review: DECREASED
WBC Morphology: INCREASED
WBC: 4.6 10*3/uL (ref 4.0–10.5)

## 2013-10-04 LAB — MAGNESIUM: Magnesium: 1.5 mg/dL (ref 1.5–2.5)

## 2013-10-04 LAB — PRO B NATRIURETIC PEPTIDE: PRO B NATRI PEPTIDE: 1422 pg/mL — AB (ref 0–450)

## 2013-10-04 MED ORDER — PRO-STAT SUGAR FREE PO LIQD: 30.0000 mL | Freq: Two times a day (BID) | ORAL | Status: DC

## 2013-10-04 MED ORDER — VITAL AF 1.2 CAL PO LIQD
1000.0000 mL | ORAL | Status: DC
  Filled 2013-10-04: qty 1000

## 2013-10-04 MED ORDER — SODIUM CHLORIDE 0.9 % IV SOLN
INTRAVENOUS | Status: DC
  Administered 2013-10-04 – 2013-10-05 (×2): via INTRAVENOUS

## 2013-10-04 MED ORDER — LIDOCAINE VISCOUS 2 % MT SOLN
15.0000 mL | Freq: Once | OROMUCOSAL | Status: AC
  Filled 2013-10-04: qty 15

## 2013-10-04 MED ORDER — SODIUM CHLORIDE 0.9 % IV BOLUS (SEPSIS)
1000.0000 mL | Freq: Once | INTRAVENOUS | Status: AC
  Administered 2013-10-04: 1000 mL via INTRAVENOUS

## 2013-10-04 MED ORDER — LIDOCAINE HCL 2 % EX GEL
CUTANEOUS | Status: AC
  Administered 2013-10-04: 11:00:00
  Filled 2013-10-04: qty 30

## 2013-10-04 MED ORDER — BACITRACIN 500 UNIT/GM EX OINT
10.0000 "application " | TOPICAL_OINTMENT | Freq: Two times a day (BID) | CUTANEOUS | Status: DC
  Administered 2013-10-04: 10 via TOPICAL
  Filled 2013-10-04 (×8): qty 9

## 2013-10-04 NOTE — Progress Notes (Addendum)
Nutrition Follow-up  Consult received for initiation of enteral feeding. NG tube placement pending. Full nutrition assessment completed 10/03/13. Pt has acute diverticulitis and metastatic lung cancer. Unable to take po due to c/o mouth pain. Advance slowly and monitor tolerance.Elemental formula chosen to support tolerance and absorption of nutrients.  Initiate Vital High Protein @ 20 ml/hr via NGT and increase by 10 ml every 8  hours to initial goal rate of 50 ml/hr. Add 30 ml Prostat BID.  At current goal rate, tube feeding regimen will provide 1440, kcal, 120 grams of protein, and  973 ml of H2O.   IVF-NS @100  ml/hr. Add free water per adult enteral feeding protocol.  Sodium  Date/Time Value Ref Range Status  10/04/2013  5:06 AM 136* 137 - 147 mEq/L Final  10/03/2013  4:13 AM 135* 137 - 147 mEq/L Final  09/21/2013  6:21 PM 130* 137 - 147 mEq/L Final    Potassium  Date/Time Value Ref Range Status  10/04/2013  5:06 AM 4.6  3.7 - 5.3 mEq/L Final  10/03/2013  4:13 AM 4.5  3.7 - 5.3 mEq/L Final  09/27/2013  6:21 PM 4.9  3.7 - 5.3 mEq/L Final    No results found for this basename: phos    Magnesium  Date/Time Value Ref Range Status  10/04/2013  5:06 AM 1.5  1.5 - 2.5 mg/dL Final  08/18/2013  8:00 AM 1.6  1.5 - 2.5 mg/dL Final  01/11/2011  4:39 AM 1.7  1.5 - 2.5 mg/dL Final     Colman Cater MS,RD,CSG,LDN Office: #[phone] Pager: [phone]

## 2013-10-04 NOTE — Progress Notes (Signed)
Attempted NG placement twice with lidocaine jelly. NG tube coiled up several times and placement could not be auscultated. MD aware. Will retry when patient is willing.

## 2013-10-04 NOTE — Progress Notes (Signed)
[facility] PROGRESS NOTE  Stephen Martin YWV:[id number] DOB: 1930/11/01 DOA: 10/03/2013 PCP: [name], MD  Assessment/Plan:  Acute diverticulitis of intestine-  -Place on iv primaxin. Ivf. Iv dilaudid and zofran prn.  -Pain free in the abdomen   Metastatic non-small cell lung cancer(NSCLC) -Patient has metastasis to the brain, peritoneal wall, and has a malignant pleural effusion left side. -November 2014 patient had a thoracentesis with pleural biopsy confirming metastasis/malignant pleural effusion. Fax was declined at that time secondary to cardiothoracic surgery believing patient would not survive procedure -Counseled patient and wife that the effusion was now loculated and procedure choice would be a chest tube or VATS procedure both of which would be high risk. Requested that I speak with RN Learta Codding [phone] they have designated as Financial planner. -RN Kathyrn Drown confirm that they have refused hospice in the past. -Left message with Dr. Harvest Forest (oncologist) [phone] requesting that he contact me in order to discuss short-term/long-term care goals for patient -2/26 again left message with Dr. Harvest Forest (oncologist) [phone]; believe patient and family has realistic expectations of the outcome of his disease process.     Malignant melanoma -See Metastatic NSCLC  Chemotherapy induced neutropenia -DC neutropenic precautions, on 2/26 ANC=5336 -Follow closely all cell lines -Continue antibiotics  Thrombocytopenia -Would transfuse if drops below 10,000 or active bleeding -2/26 improving but still low  Left loculated pleural effusion -See metastatic NSCLC   Cardiomyopathy -Stable -2/25 proBNP = 1422  Atrial fibrillation -Currently in NSR -Continue Coreg  Gram-positive cocci in chain bacteremia -Patient currently on vancomycin and Primaxin which should cover organisms. Await speciation and sensitivities    Hyponatremia -Resolved  UTI -Urine culture pending -Patient currently on Primaxin + vancomycin which should cover all of the routine UTI associated bacteria.   Code Status: DO NOT RESUSCITATE  Family Communication:  Disposition Plan:    Consultants:    Procedures: CT abdomen pelvis with contrast 09/11/2013 1. New sigmoid colonic inflammatory changes with stranding in the  fat. Favored consideration is diverticulitis however this could also  represent stranding associated with colitis. Mesenteric infarction  or panniculitis less likely. No perforation or abscess.  2. Loculated left pleural effusion with enhancing pleural soft  tissue nodules, most suggestive of pleural carcinomatosis. Unchanged  small pericardial fluid or thickening.  3. Cholecystectomy. Renal cysts.  4. Massive enlargement of the prostate gland is unchanged.   Echocardiogram 06/28/2013 - Left ventricle: The cavity size was mildly dilated. Wall  thickness was normal.  -LVEF=  20% to 25%. Diffuse hypokinesis. - Pericardium, extracardiac: A trivial pericardial effusion was identified.      Antibiotics: Primaxin 2/25>> Vancomycin 2/25>>    HPI/Subjective: [name] PMHx chronic systolic CHF, atrial fibrillation non-small cell lung cancer left upper lobe S/P radiation and chemotherapy initial treatment completed 04/25/2006, 1/21 2015 patient was scheduled to start brain radiation therapy secondary to metastatic disease. It was also recommended at that time patient had an ultrasound guided left thoracentesis for recurrent left pleural effusion Dr. Tharon Aquas Trigt (cardiothoracic surgeon), S/P left pleural biopsy 07/30/2013 which showed metastatic high grade carcinoma, sarcomatoid type,, melanoma, pancreatic insufficiency. Presented to ED comes in with lower abd pain and diarrhea for over 24 hours. Has also had some n/v nonbloody. He feels awful. Diarrhea nonbloody. No fevers at home. Ct shows prob acute  diverticlulitis. Has not been doing well with the chemo and also getting radiation to brain mets. Patient was started on Primaxin and vancomycin 2/25 RN Learta Codding [phone] verified that patient  had received brain radiation 06/2013, and had received Taxol+ carboplatin chemotherapy last week 2/26 consultation on need to have nutrition if he is to have any chance of survival. Patient agreed to allow as to place a NG tube for feeding/medicine purposes. Complains of oral pain secondary to lesions (possible secondary to XRT?/Chemotherapy?)   Objective: Filed Vitals:   10/04/13 0500 10/04/13 0530 10/04/13 0600 10/04/13 0630  BP: 106/53 123/103 106/68 97/64  Pulse:  112 142 103  Temp:      TempSrc:      Resp: 23 17 22 26   Height:      Weight: 76.8 kg (169 lb 5 oz)     SpO2:  99% 100% 100%    Intake/Output Summary (Last 24 hours) at 10/04/13 0746 Last data filed at 10/04/13 0511  Gross per 24 hour  Intake    400 ml  Output    600 ml  Net   -200 ml   Filed Weights   09/19/2013 1721 09/21/2013 2330 10/04/13 0500  Weight: 77.111 kg (170 lb) 72.5 kg (159 lb 13.3 oz) 76.8 kg (169 lb 5 oz)    Exam:   General: A./O. x4, NAD, complains of oral pain; evaluation shows multiple lesions along the left cheek and soft palate more consistent with burns then oral thrush.   Cardiovascular: Tachycardic, regular rhythm, negative murmurs rubs or gallops  Respiratory: No breath sounds heard left lower lung/lingula, course left upper lobe breath sounds left upper lobe  Abdomen: Soft, nontender, nondistended, plus bowel sounds  Musculoskeletal: Cachectic, positive pedal edema 2+ to mid calf bilateral   Data Reviewed: Basic Metabolic Panel:  Recent Labs Lab 10/01/2013 1821 10/03/13 0413 10/04/13 0506  NA 130* 135* 136*  K 4.9 4.5 4.6  CL 94* 101 101  CO2 27 24 22   GLUCOSE 82 52* 70  BUN 32* 34* 37*  CREATININE 0.79 0.86 0.85  CALCIUM 8.3* 7.5* 8.2*  MG  --   --  1.5   Liver Function  Tests:  Recent Labs Lab 09/12/2013 1821 10/04/13 0506  AST 17 13  ALT 21 17  ALKPHOS 95 96  BILITOT 1.6* 0.8  PROT 5.9* 5.3*  ALBUMIN 2.2* 1.6*   No results found for this basename: LIPASE, AMYLASE,  in the last 168 hours No results found for this basename: AMMONIA,  in the last 168 hours CBC:  Recent Labs Lab 09/26/2013 1821 10/03/13 0413 10/04/13 0506  WBC 0.3* 0.5* 4.6  NEUTROABS  --   --  4.4  HGB 12.9* 12.1* 11.2*  HCT 40.2 37.6* 34.3*  MCV 86.1 86.8 86.2  PLT 33* 27* 35*   Cardiac Enzymes: No results found for this basename: CKTOTAL, CKMB, CKMBINDEX, TROPONINI,  in the last 168 hours BNP (last 3 results)  Recent Labs  10/04/13 0506  PROBNP 1422.0*   CBG: No results found for this basename: GLUCAP,  in the last 168 hours  Recent Results (from the past 240 hour(s))  CULTURE, BLOOD (ROUTINE X 2)     Status: None   Collection Time    09/09/2013  8:41 PM      Result Value Ref Range Status   Specimen Description BLOOD PICC LINE DRAWN BY RN   Final   Special Requests BOTTLES DRAWN AEROBIC AND ANAEROBIC [facility] EACH   Final   Culture  Setup Time     Final   Value: 10/04/2013 00:09     Performed at [facility]  Final   Value: GRAM POSITIVE COCCI IN CHAINS     Note: Gram Stain Report Called to,Read Back By and Verified With: ROWE NADINE AT 1834 10/03/13 BY Elza Rafter Performed at [facility]     Performed at [facility]   Report Status PENDING   Incomplete  CULTURE, BLOOD (ROUTINE X 2)     Status: None   Collection Time    10/01/2013  8:46 PM      Result Value Ref Range Status   Specimen Description BLOOD LEFT WRIST   Final   Special Requests BOTTLES DRAWN AEROBIC AND ANAEROBIC 4CC EACH   Final   Culture  Setup Time     Final   Value: 10/04/2013 00:08     Performed at [facility]   Culture     Final   Value: GRAM POSITIVE COCCI IN CHAINS     Note: Gram Stain Report Called to,Read Back By and Verified With: ROWE NADINE AT  1834 10/03/13 BY Moshe Salisbury Performed at [facility]     Performed at [facility]   Report Status PENDING   Incomplete  MRSA PCR SCREENING     Status: None   Collection Time    10/01/2013 10:40 PM      Result Value Ref Range Status   MRSA by PCR NEGATIVE  NEGATIVE Final   Comment:            The GeneXpert MRSA Assay (FDA     approved for NASAL specimens     only), is one component of a     comprehensive MRSA colonization     surveillance program. It is not     intended to diagnose MRSA     infection nor to guide or     monitor treatment for     MRSA infections.     Studies: Ct Abdomen Pelvis W Contrast  10/01/2013   CLINICAL DATA:  Abdominal pain.  EXAM: CT ABDOMEN AND PELVIS WITH CONTRAST  TECHNIQUE: Multidetector CT imaging of the abdomen and pelvis was performed using the standard protocol following bolus administration of intravenous contrast.  CONTRAST:  144mL OMNIPAQUE IOHEXOL 300 MG/ML  SOLN  COMPARISON:  CT ABD - PELV W/ CM dated 08/18/2013; CT PELVIS W/O & W/CM dated 07/24/2009  FINDINGS: Lung Bases: Large loculated left pleural effusion with enhancing soft tissue implants along the pleural surface which have increased in size compared to the prior exam last month. Index lesion on image number 22 now measures 37 mm, previously 34 mm. The more superior medial implant shows more avid enhancement and is better visualize than on the prior study. This measures 26 mm long axis. Findings suggest pleural carcinomatosis. No spread through the chest wall is identified. Atelectasis of the right base is present. Small amount of pericardial fluid or thickening.  Liver:  Unchanged.  No evidence metastases.  Spleen:  Normal.  Gallbladder:  Surgically absent.  Common bile duct: Postcholecystectomy dilation of the biliary system.  Pancreas: Fatty atrophy of the pancreas. Small periampullary duodenal diverticulum.  Adrenal glands:  Normal bilaterally.  Kidneys: Multiple bilateral renal  cysts. Ureters appear within normal limits. Normal enhancement and excretion of contrast. Renal sinus cyst noted.  Stomach:  Grossly normal.  Small bowel:  No obstruction or mesenteric adenopathy.  Colon: There is new stranding along the sigmoid colon with severe underlying diverticulosis. This may represent reactive stranding associated with colitis but given the diverticular disease, diverticulitis is more  likely. No perforation or abscess. No discrete mass.  Pelvic Genitourinary: Unchanged massive prostate enlargement impressing on the urinary bladder.  Bones: Lumbar spondylosis and facet arthrosis. Right greater than left hip osteoarthritis. No large destructive osseous lesions.  Vasculature: Atherosclerosis. No aneurysm or acute vascular abnormality.  Body Wall: Normal.  IMPRESSION: 1. New sigmoid colonic inflammatory changes with stranding in the fat. Favored consideration is diverticulitis however this could also represent stranding associated with colitis. Mesenteric infarction or panniculitis less likely. No perforation or abscess. 2. Loculated left pleural effusion with enhancing pleural soft tissue nodules, most suggestive of pleural carcinomatosis. Unchanged small pericardial fluid or thickening. 3. Cholecystectomy.  Renal cysts. 4. Massive enlargement of the prostate gland is unchanged.   Electronically Signed   By: Dereck Ligas [name]   On: 09/30/2013 20:09    Scheduled Meds: . aspirin EC  81 mg Oral Daily  . bacitracin   Topical BID  . carvedilol  6.25 mg Oral BID WC  . dexamethasone  4 mg Oral TID  . enoxaparin (LOVENOX) injection  40 mg Subcutaneous Q24H  . imipenem-cilastatin  500 mg Intravenous Q8H  . megestrol  800 mg Oral Daily  . oxybutynin  5 mg Oral Daily  . pantoprazole  40 mg Oral Daily  . vancomycin  1,000 mg Intravenous Q12H   Continuous Infusions:    Principal Problem:   Acute diverticulitis of intestine Active Problems:   Chronic anticoagulation   Atrial  fibrillation   Non-small cell lung cancer   Nonsustained ventricular tachycardia   Pleural effusion, malignant   Cardiomyopathy   Loculated pleural effusion   Pancreatic insufficiency   Sinus tachycardia   Chemotherapy induced neutropenia   Colitis   Thrombocytopenia, unspecified   Gram-positive cocci bacteremia    Time spent: 40 minutes    Martin, Stephen Martin, Stephen  [facility] Pager [phone]. If 7PM-7AM, please contact night-coverage at [email or web address], password [facility] 10/04/2013, 7:46 AM  LOS: 2 days

## 2013-10-05 DIAGNOSIS — Z515 Encounter for palliative care: Secondary | ICD-10-CM

## 2013-10-05 LAB — CULTURE, BLOOD (ROUTINE X 2)

## 2013-10-05 LAB — COMPREHENSIVE METABOLIC PANEL
ALBUMIN: 1.6 g/dL — AB (ref 3.5–5.2)
ALK PHOS: 139 U/L — AB (ref 39–117)
ALT: 14 U/L (ref 0–53)
AST: 13 U/L (ref 0–37)
BILIRUBIN TOTAL: 0.7 mg/dL (ref 0.3–1.2)
BUN: 35 mg/dL — AB (ref 6–23)
CO2: 21 mEq/L (ref 19–32)
Calcium: 8.4 mg/dL (ref 8.4–10.5)
Chloride: 101 mEq/L (ref 96–112)
Creatinine, Ser: 0.74 mg/dL (ref 0.50–1.35)
GFR calc Af Amer: >90 mL/min (ref >90)
GFR calc non Af Amer: 85 mL/min — ABNORMAL LOW (ref >90)
Glucose, Bld: 84 mg/dL (ref 70–99)
Potassium: 4.4 mEq/L (ref 3.7–5.3)
Sodium: 135 mEq/L — ABNORMAL LOW (ref 137–147)
Total Protein: 5.6 g/dL — ABNORMAL LOW (ref 6.0–8.3)

## 2013-10-05 LAB — CBC WITH DIFFERENTIAL/PLATELET
BLASTS: 0 %
Band Neutrophils: 0 % (ref 0–10)
Basophils Absolute: 0 10*3/uL (ref 0.0–0.1)
Basophils Relative: 0 % (ref 0–1)
Eosinophils Absolute: 0 10*3/uL (ref 0.0–0.7)
Eosinophils Relative: 0 % (ref 0–5)
HCT: 36.2 % — ABNORMAL LOW (ref 39.0–52.0)
Hemoglobin: 11.8 g/dL — ABNORMAL LOW (ref 13.0–17.0)
Lymphocytes Relative: 2 % — ABNORMAL LOW (ref 12–46)
Lymphs Abs: 0.2 10*3/uL — ABNORMAL LOW (ref 0.7–4.0)
MCH: 27.9 pg (ref 26.0–34.0)
MCHC: 32.6 g/dL (ref 30.0–36.0)
MCV: 85.6 fL (ref 78.0–100.0)
METAMYELOCYTES PCT: 0 %
MYELOCYTES: 0 %
Monocytes Absolute: 0 10*3/uL — ABNORMAL LOW (ref 0.1–1.0)
Monocytes Relative: 0 % — ABNORMAL LOW (ref 3–12)
Neutro Abs: 10.5 10*3/uL — ABNORMAL HIGH (ref 1.7–7.7)
Neutrophils Relative %: 98 % — ABNORMAL HIGH (ref 43–77)
PLATELETS: 36 10*3/uL — AB (ref 150–400)
PROMYELOCYTES ABS: 0 %
RBC: 4.23 MIL/uL (ref 4.22–5.81)
RDW: 22 % — AB (ref 11.5–15.5)
WBC Morphology: INCREASED
WBC: 10.7 10*3/uL — AB (ref 4.0–10.5)
nRBC: 0 /100 WBC

## 2013-10-05 LAB — PROCALCITONIN: Procalcitonin: 29.95 ng/mL

## 2013-10-05 LAB — MAGNESIUM: MAGNESIUM: 1.5 mg/dL (ref 1.5–2.5)

## 2013-10-05 MED ORDER — LIDOCAINE HCL 2 % EX GEL
CUTANEOUS | Status: DC
  Filled 2013-10-05 (×4): qty 5

## 2013-10-05 MED ORDER — METOPROLOL TARTRATE 1 MG/ML IV SOLN: 5.0000 mg | Freq: Four times a day (QID) | INTRAVENOUS | Status: DC

## 2013-10-05 MED ORDER — MORPHINE SULFATE 4 MG/ML IJ SOLN: 4.0000 mg | INTRAMUSCULAR | Status: DC | PRN

## 2013-10-05 MED ORDER — MORPHINE SULFATE (CONCENTRATE) 10 MG /0.5 ML PO SOLN: 10.0000 mg | ORAL | Status: DC | PRN

## 2013-10-05 MED ORDER — LORAZEPAM 2 MG/ML IJ SOLN: 0.5000 mg | Freq: Once | INTRAMUSCULAR | Status: AC

## 2013-10-05 MED ORDER — MORPHINE SULFATE 4 MG/ML IJ SOLN
INTRAMUSCULAR | Status: AC
  Administered 2013-10-05: 4 mg
  Filled 2013-10-05: qty 1

## 2013-10-05 MED ORDER — LORAZEPAM 2 MG/ML IJ SOLN
INTRAMUSCULAR | Status: AC
  Administered 2013-10-05: 2 mg
  Filled 2013-10-05: qty 1

## 2013-10-05 MED ORDER — LIDOCAINE VISCOUS 2 % MT SOLN
15.0000 mL | OROMUCOSAL | Status: AC
  Administered 2013-10-05: 15 mL via OROMUCOSAL
  Filled 2013-10-05 (×4): qty 15

## 2013-10-05 MED ORDER — LORAZEPAM 2 MG/ML IJ SOLN
0.5000 mg | Freq: Once | INTRAMUSCULAR | Status: AC
  Administered 2013-10-05: 0.5 mg via INTRAVENOUS
  Filled 2013-10-05: qty 1

## 2013-10-05 MED ORDER — LORAZEPAM 2 MG/ML IJ SOLN: 0.5000 mg | Freq: Once | INTRAMUSCULAR | Status: DC

## 2013-10-05 MED ORDER — MORPHINE SULFATE 4 MG/ML IJ SOLN
4.0000 mg | INTRAMUSCULAR | Status: DC
  Administered 2013-10-05 – 2013-10-06 (×3): 4 mg via INTRAVENOUS
  Filled 2013-10-05 (×3): qty 1

## 2013-10-05 MED ORDER — MORPHINE SULFATE 4 MG/ML IJ SOLN: 4.0000 mg | INTRAMUSCULAR | Status: AC | PRN

## 2013-10-05 MED ORDER — LIDOCAINE VISCOUS 2 % MT SOLN: 15.0000 mL | OROMUCOSAL | Status: AC

## 2013-10-05 NOTE — Clinical Social Work Note (Signed)
Clinicals faxed to [facility] in [place], will be reviewed for available beds.  Edwyna Shell, LCSW Clinical Social Worker [phone])

## 2013-10-05 NOTE — Progress Notes (Signed)
ANTIBIOTIC CONSULT NOTE - FOLLOW UP  Pharmacy Consult for Vancomycin and Primaxin Indication: Bacteremia and intra-abdominal infection  Allergies  Allergen Reactions  . Amoxil [Amoxicillin]     RASH  . Cefzil [Cefprozil]     unknown  . Levaquin [Levofloxacin In D5w] Rash and Other (See Comments)    Developed rash after 2 rounds of IV and PO meds. Family unsure if this is the actual culprit of the rash   Patient Measurements: Height: 6\' 1"  (185.4 cm) Weight: 170 lb 3.2 oz (77.202 kg) IBW/kg (Calculated) : 79.9  Vital Signs: Temp: 97.6 F (36.4 C) (02/27 0400) Temp src: Axillary (02/27 0400) BP: 150/95 mmHg (02/27 0700) Pulse Rate: 116 (02/26 2100) Intake/Output from previous day: 02/26 0701 - 02/27 0700 In: 3603.3 [I.V.:1903.3; IV Piggyback:1700] Out: 975 [Urine:975] Intake/Output from this shift:    Labs:  Recent Labs  10/03/13 0413 10/04/13 0506 10/05/13 0407  WBC 0.5* 4.6 10.7*  HGB 12.1* 11.2* 11.8*  PLT 27* 35* 36*  CREATININE 0.86 0.85 0.74   Estimated Creatinine Clearance: 80.4 ml/min (by C-G formula based on Cr of 0.74). No results found for this basename: VANCOTROUGH, Corlis Leak, VANCORANDOM, [place], GENTPEAK, GENTRANDOM, TOBRATROUGH, TOBRAPEAK, TOBRARND, AMIKACINPEAK, AMIKACINTROU, AMIKACIN,  in the last 72 hours   Microbiology: Recent Results (from the past 720 hour(s))  CULTURE, BLOOD (ROUTINE X 2)     Status: None   Collection Time    10/04/2013  8:41 PM      Result Value Ref Range Status   Specimen Description BLOOD PICC LINE DRAWN BY RN   Final   Special Requests BOTTLES DRAWN AEROBIC AND ANAEROBIC [facility] EACH   Final   Culture  Setup Time     Final   Value: 10/04/2013 00:09     Performed at [facility]   Culture     Final   Value: GRAM POSITIVE COCCI IN CHAINS     Note: Gram Stain Report Called to,Read Back By and Verified With: ROWE NADINE AT 1834 10/03/13 BY Elza Rafter Performed at [facility]     Performed at [facility]   Report Status PENDING   Incomplete  CULTURE, BLOOD (ROUTINE X 2)     Status: None   Collection Time    09/11/2013  8:46 PM      Result Value Ref Range Status   Specimen Description BLOOD LEFT WRIST   Final   Special Requests BOTTLES DRAWN AEROBIC AND ANAEROBIC 4CC EACH   Final   Culture  Setup Time     Final   Value: 10/04/2013 00:08     Performed at [facility]   Culture     Final   Value: GRAM POSITIVE COCCI IN CHAINS     Note: Gram Stain Report Called to,Read Back By and Verified With: ROWE NADINE AT 1834 10/03/13 BY Moshe Salisbury Performed at [facility]     Performed at [facility]   Report Status PENDING   Incomplete  MRSA PCR SCREENING     Status: None   Collection Time    09/19/2013 10:40 PM      Result Value Ref Range Status   MRSA by PCR NEGATIVE  NEGATIVE Final   Comment:            The GeneXpert MRSA Assay (FDA     approved for NASAL specimens     only), is one component of a     comprehensive MRSA colonization  surveillance program. It is not     intended to diagnose MRSA     infection nor to guide or     monitor treatment for     MRSA infections.    Anti-infectives   Start     Dose/Rate Route Frequency Ordered Stop   10/03/13 2100  vancomycin (VANCOCIN) IVPB 1000 mg/200 mL premix     1,000 mg 200 mL/hr over 60 Minutes Intravenous Every 12 hours 10/03/13 0849     10/03/13 0900  vancomycin (VANCOCIN) 1,500 mg in sodium chloride 0.9 % 500 mL IVPB     1,500 mg 250 mL/hr over 120 Minutes Intravenous  Once 10/03/13 0740 10/03/13 1148   10/03/13 0630  vancomycin (VANCOCIN) IVPB 750 mg/150 ml premix  Status:  Discontinued     750 mg 150 mL/hr over 60 Minutes Intravenous  Once 10/03/13 0624 10/04/13 1629   10/03/13 0500  imipenem-cilastatin (PRIMAXIN) 500 mg in sodium chloride 0.9 % 100 mL IVPB     500 mg 200 mL/hr over 30 Minutes Intravenous Every 8 hours 09/28/2013 2235     10/01/2013 2245  imipenem-cilastatin (PRIMAXIN) 500 mg in  sodium chloride 0.9 % 100 mL IVPB  Status:  Discontinued     500 mg 200 mL/hr over 30 Minutes Intravenous 3 times per day 09/14/2013 2231 10/03/2013 2235   10/04/2013 2045  imipenem-cilastatin (PRIMAXIN) 500 mg in sodium chloride 0.9 % 100 mL IVPB     500 mg 200 mL/hr over 30 Minutes Intravenous  Once 09/26/2013 2041 10/04/13 1529     Assessment: 80yo male with h/o lung cancer with mets to brain and s/p chemo Rx.  Pt presents to ED c/o lower abdominal pain and diarrhea.  Pt  neutropenic and thrombocytopenic on admission.  Neutropenia has improved.   Currently afebrile.    Estimated Creatinine Clearance: 80.4 ml/min (by C-G formula based on Cr of 0.74).  Goal of Therapy:  Vancomycin trough level 15-20 mcg/ml Eradicate infection.  Plan:  Continue Vancomycin 1000mg  IV q12hrs Check trough at steady state Continue Primaxin 500mg  IV q8hrs Monitor labs, renal fxn, and cultures Duration of therapy per MD  Hart Robinsons A 10/05/2013,8:51 AM

## 2013-10-05 NOTE — Care Management Note (Signed)
    Page 1 of 1   10/05/2013     12:22:31 PM   CARE MANAGEMENT NOTE 10/05/2013  Patient:  Stephen Martin, Stephen Martin   Account Number:  [id number]  Date Initiated:  10/05/2013  Documentation initiated by:  Theophilus Kinds  Subjective/Objective Assessment:   Pt admitted from home with diverticulits. Pt lives with his wife and is requiring some assistance with ADL's. Pt has been undergoing treatment for cancer with mets.     Action/Plan:   MD spoke with pt and family about prognosis. Pt is agreeable to [facility]. CSW to arrange discharge to facility today.   Anticipated DC Date:  10/05/2013   Anticipated DC Plan:  [place]  In-house referral  Clinical Social Worker      DC Planning Services  CM consult      Choice offered to / List presented to:             Status of service:  Completed, signed off Medicare Important Message given?   (If response is "NO", the following Medicare IM given date fields will be blank) Date Medicare IM given:   Date Additional Medicare IM given:    Discharge Disposition:  [place]  Per UR Regulation:    If discussed at Long Length of Stay Meetings, dates discussed:    Comments:  10/05/13 [place], RN BSN CM

## 2013-10-05 NOTE — Progress Notes (Signed)
Patient resting comfortably, family at bedside.  Will continue to monitor. Schonewitz, Eulis Canner 10/05/2013

## 2013-10-05 NOTE — Progress Notes (Signed)
MD reassessed patient and cancelled d/c to [place].  Patient deconditioned from earlier this am, MD felt he was imminent.  Will continue to monitor patient.  Schonewitz, Eulis Canner 10/05/2013

## 2013-10-05 NOTE — Discharge Summary (Signed)
Physician Discharge Summary  Stephen Martin DXI:[id number] DOB: [birth year] DOA: 09/27/2013  PCP: [name], MD  Admit date: 09/19/2013 Discharge date: 10/05/2013  Time spent: 40 minutes  Recommendations for Outpatient Follow-up:  1. Hospice home full comfort  Discharge Diagnoses:  Principal Problem:   Acute diverticulitis Active Problems:   Chronic anticoagulation   Atrial fibrillation   Non-small cell lung cancer   Nonsustained ventricular tachycardia   Pleural effusion, malignant   Cardiomyopathy   Loculated pleural effusion   Pancreatic insufficiency   Sinus tachycardia   Chemotherapy induced neutropenia   Colitis   Thrombocytopenia, unspecified   Gram-positive cocci bacteremia   Hospice care   Discharge Condition: Guarded  Diet recommendation: comfort feeds  Filed Weights   09/19/2013 2330 10/04/13 0500 10/05/13 0500  Weight: 72.5 kg (159 lb 13.3 oz) 76.8 kg (169 lb 5 oz) 77.202 kg (170 lb 3.2 oz)    History of present illness:  80 yo male with lung cancer mets to brain on chemo, loculated left pleural effusion opted not to drain, aflutter, comes in with lower abd pain and diarrhea for over 24 hours. Has also had some n/v nonbloody. He feels awful. Diarrhea nonbloody. No fevers at home. Ct shows prob acute diverticlulitis. Has not been doing well with the chemo and also getting radiation to brain mets.      Hospital Course:  Acute diverticulitis  - Place on iv primaxin. Ivf. Iv dilaudid and zofran prn.  - Pain free in the abdomen.  - d/w family and pt him and family will like to move towards comfort care.  - He will like to go to a hospice house.  - He will like antibiotics stopped.  - will go with morphine and ativan prn.  Gram-positive cocci in chain bacteremia:  - 2/2 BC positive Gram + cocci in chain.  -Patient currently on vancomycin and Primaxin which should cover organisms. - d/c IV antibiotics.  Metastatic non-small cell lung cancer(NSCLC)/ Pleural  effusion, malignant :  - Patient has metastasis to the brain, peritoneal wall, and has a malignant pleural effusion left side.  - November 2014 patient had a thoracentesis with pleural biopsy confirming metastasis/malignant pleural effusion. Fax was declined at that time secondary to cardiothoracic surgery believing patient would not survive procedure  - RN Kathyrn Drown confirm that they have refused hospice in the past.  - On 2.27.2015 had a long conversation with Pt and family and he relates he is tired he is in pain. After explaining to him the risk and benefit he agreed to move toward comfort care.   Malignant melanoma  -See Metastatic NSCLC   Chemotherapy induced neutropenia  -DC neutropenic precautions, on 2/26 ANC=5336  -Follow closely all cell lines   Thrombocytopenia  -Would transfuse if drops below 10,000 or active bleeding  -2/26 improving but still low   Left loculated pleural effusion  -See metastatic NSCLC   Atrial fibrillation  -Currently in NSR.  -Continue Coreg.  Chemotherapy induced neutropenia  - now resolved.   Procedures:  CT abd  Consultations:  none  Discharge Exam: Filed Vitals:   10/05/13 1100  BP: 124/84  Pulse: 119  Temp:   Resp: 22    General: A&O x3 Cardiovascular: RRR Respiratory: good air movement CTA B/L  Discharge Instructions  Discharge Orders   Future Orders Complete By Expires   Diet - low sodium heart healthy  As directed    Increase activity slowly  As directed  Medication List    STOP taking these medications       acetaminophen 500 MG tablet  Commonly known as:  TYLENOL     ALPRAZolam 0.5 MG tablet  Commonly known as:  XANAX     aspirin EC 81 MG tablet     carvedilol 6.25 MG tablet  Commonly known as:  COREG     dexamethasone 4 MG tablet  Commonly known as:  DECADRON     furosemide 40 MG tablet  Commonly known as:  LASIX     ondansetron 4 MG disintegrating tablet  Commonly known as:  ZOFRAN-ODT      oxybutynin 5 MG tablet  Commonly known as:  DITROPAN     pantoprazole 40 MG tablet  Commonly known as:  PROTONIX     prochlorperazine 10 MG tablet  Commonly known as:  COMPAZINE      TAKE these medications       lidocaine 2 % solution  Commonly known as:  XYLOCAINE  Use as directed 15 mLs in the mouth or throat every 1 hour x 4 doses.     LORazepam 2 MG/ML injection  Commonly known as:  ATIVAN  Inject 0.25 mLs (0.5 mg total) into the vein once.     morphine 4 MG/ML injection  Inject 1 mL (4 mg total) into the vein every 3 (three) hours as needed for severe pain.       Allergies  Allergen Reactions  . Amoxil [Amoxicillin]     RASH  . Cefzil [Cefprozil]     unknown  . Levaquin [Levofloxacin In D5w] Rash and Other (See Comments)    Developed rash after 2 rounds of IV and PO meds. Family unsure if this is the actual culprit of the rash      The results of significant diagnostics from this hospitalization (including imaging, microbiology, ancillary and laboratory) are listed below for reference.    Significant Diagnostic Studies: Ct Abdomen Pelvis W Contrast  09/18/2013   CLINICAL DATA:  Abdominal pain.  EXAM: CT ABDOMEN AND PELVIS WITH CONTRAST  TECHNIQUE: Multidetector CT imaging of the abdomen and pelvis was performed using the standard protocol following bolus administration of intravenous contrast.  CONTRAST:  137mL OMNIPAQUE IOHEXOL 300 MG/ML  SOLN  COMPARISON:  CT ABD - PELV W/ CM dated 08/18/2013; CT PELVIS W/O & W/CM dated 07/24/2009  FINDINGS: Lung Bases: Large loculated left pleural effusion with enhancing soft tissue implants along the pleural surface which have increased in size compared to the prior exam last month. Index lesion on image number 22 now measures 37 mm, previously 34 mm. The more superior medial implant shows more avid enhancement and is better visualize than on the prior study. This measures 26 mm long axis. Findings suggest pleural carcinomatosis.  No spread through the chest wall is identified. Atelectasis of the right base is present. Small amount of pericardial fluid or thickening.  Liver:  Unchanged.  No evidence metastases.  Spleen:  Normal.  Gallbladder:  Surgically absent.  Common bile duct: Postcholecystectomy dilation of the biliary system.  Pancreas: Fatty atrophy of the pancreas. Small periampullary duodenal diverticulum.  Adrenal glands:  Normal bilaterally.  Kidneys: Multiple bilateral renal cysts. Ureters appear within normal limits. Normal enhancement and excretion of contrast. Renal sinus cyst noted.  Stomach:  Grossly normal.  Small bowel:  No obstruction or mesenteric adenopathy.  Colon: There is new stranding along the sigmoid colon with severe underlying diverticulosis. This may represent reactive stranding associated with  colitis but given the diverticular disease, diverticulitis is more likely. No perforation or abscess. No discrete mass.  Pelvic Genitourinary: Unchanged massive prostate enlargement impressing on the urinary bladder.  Bones: Lumbar spondylosis and facet arthrosis. Right greater than left hip osteoarthritis. No large destructive osseous lesions.  Vasculature: Atherosclerosis. No aneurysm or acute vascular abnormality.  Body Wall: Normal.  IMPRESSION: 1. New sigmoid colonic inflammatory changes with stranding in the fat. Favored consideration is diverticulitis however this could also represent stranding associated with colitis. Mesenteric infarction or panniculitis less likely. No perforation or abscess. 2. Loculated left pleural effusion with enhancing pleural soft tissue nodules, most suggestive of pleural carcinomatosis. Unchanged small pericardial fluid or thickening. 3. Cholecystectomy.  Renal cysts. 4. Massive enlargement of the prostate gland is unchanged.   Electronically Signed   By: Dereck Ligas [name]   On: 10/05/2013 20:09    Microbiology: Recent Results (from the past 240 hour(s))  CULTURE, BLOOD (ROUTINE  X 2)     Status: None   Collection Time    09/18/2013  8:41 PM      Result Value Ref Range Status   Specimen Description BLOOD PICC LINE DRAWN BY RN   Final   Special Requests BOTTLES DRAWN AEROBIC AND ANAEROBIC 5CC EACH   Final   Culture  Setup Time     Final   Value: 10/04/2013 00:09     Performed at [facility]   Culture     Final   Value: GRAM POSITIVE COCCI IN CHAINS        BLOOD CULTURE RECEIVED NO GROWTH TO DATE CULTURE WILL BE HELD FOR 5 DAYS BEFORE ISSUING A FINAL NEGATIVE REPORT     Note: Gram Stain Report Called to,Read Back By and Verified With: ROWE NADINE AT 1834 10/03/13 BY Elza Rafter Performed at [facility]     Performed at [facility]   Report Status PENDING   Incomplete  CULTURE, BLOOD (ROUTINE X 2)     Status: None   Collection Time    09/26/2013  8:46 PM      Result Value Ref Range Status   Specimen Description BLOOD LEFT WRIST   Final   Special Requests BOTTLES DRAWN AEROBIC AND ANAEROBIC 4CC EACH   Final   Culture  Setup Time     Final   Value: 10/04/2013 00:08     Performed at [facility]   Culture     Final   Value: GRAM POSITIVE COCCI IN CHAINS     Note: Gram Stain Report Called to,Read Back By and Verified With: ROWE NADINE AT 1834 10/03/13 BY Moshe Salisbury Performed at [facility]     Performed at [facility]   Report Status PENDING   Incomplete  MRSA PCR SCREENING     Status: None   Collection Time    10/05/2013 10:40 PM      Result Value Ref Range Status   MRSA by PCR NEGATIVE  NEGATIVE Final   Comment:            The GeneXpert MRSA Assay (FDA     approved for NASAL specimens     only), is one component of a     comprehensive MRSA colonization     surveillance program. It is not     intended to diagnose MRSA     infection nor to guide or     monitor treatment for     MRSA infections.  Labs: Basic Metabolic Panel:  Recent Labs Lab 09/11/2013 1821 10/03/13 0413 10/04/13 0506 10/05/13 0407  NA  130* 135* 136* 135*  K 4.9 4.5 4.6 4.4  CL 94* 101 101 101  CO2 27 24 22 21   GLUCOSE 82 52* 70 84  BUN 32* 34* 37* 35*  CREATININE 0.79 0.86 0.85 0.74  CALCIUM 8.3* 7.5* 8.2* 8.4  MG  --   --  1.5 1.5   Liver Function Tests:  Recent Labs Lab 09/15/2013 1821 10/04/13 0506 10/05/13 0407  AST 17 13 13   ALT 21 17 14   ALKPHOS 95 96 139*  BILITOT 1.6* 0.8 0.7  PROT 5.9* 5.3* 5.6*  ALBUMIN 2.2* 1.6* 1.6*   No results found for this basename: LIPASE, AMYLASE,  in the last 168 hours No results found for this basename: AMMONIA,  in the last 168 hours CBC:  Recent Labs Lab 10/01/2013 1821 10/03/13 0413 10/04/13 0506 10/05/13 0407  WBC 0.3* 0.5* 4.6 10.7*  NEUTROABS  --   --  4.4 10.5*  HGB 12.9* 12.1* 11.2* 11.8*  HCT 40.2 37.6* 34.3* 36.2*  MCV 86.1 86.8 86.2 85.6  PLT 33* 27* 35* 36*   Cardiac Enzymes: No results found for this basename: CKTOTAL, CKMB, CKMBINDEX, TROPONINI,  in the last 168 hours BNP: BNP (last 3 results)  Recent Labs  10/04/13 0506  PROBNP 1422.0*   CBG: No results found for this basename: GLUCAP,  in the last 168 hours     Signed:  Charlynne Cousins  [facility] 10/05/2013, 11:51 AM

## 2013-10-05 NOTE — Progress Notes (Signed)
Patient resting comfortably, no acute distress.  Will continue to monitor. Schonewitz, Eulis Canner 10/05/2013

## 2013-10-05 NOTE — Progress Notes (Signed)
Nutrition Brief Note  Chart reviewed. Pt now transitioning to comfort care.  No further nutrition interventions warranted at this time.  Please re-consult as needed.   Merlin Golden A. Jimmye Norman, RD, LDN Pager: [phone]

## 2013-10-05 NOTE — Progress Notes (Signed)
MD met with patient's family.  Discussed comfort care measures.  Family agreeable.  SW called Hospice Home consult.  Hospice RN to meet with family at 1pm.  Patient resting well at this time c/o sore throat from radiation burns.  MD ordered viscous lidocaine to soothe sore throat.  Orders carried out.  Will continue to monitor patient.  Schonewitz, Eulis Canner 10/05/2013

## 2013-10-05 NOTE — Progress Notes (Signed)
[facility] CALLED AND INFORMED us THAT MR STONE'S BLOOD CULTURE FROM 09/10/2013 IS POSITIVE FOR BETA STREP GROUP A.

## 2013-10-05 NOTE — Clinical Social Work Psychosocial (Signed)
    Clinical Social Work Department BRIEF PSYCHOSOCIAL ASSESSMENT 10/05/2013  Patient:  Stephen Martin, Stephen Martin     Account Number:  [id number]     Admit date:  09/24/2013  Clinical Social Worker:  Edwyna Shell, [place]  Date/Time:  10/05/2013 12:00 N  Referred by:  Physician  Date Referred:  10/05/2013 Referred for  Residential hospice placement   Other Referral:   Interview type:  Family Other interview type:    PSYCHOSOCIAL DATA Living Status:  FAMILY Admitted from facility:   Level of care:   Primary support name:  Stephen Martin Primary support relationship to patient:  SPOUSE Degree of support available:   Very supportive and involved family    CURRENT CONCERNS Current Concerns  Post-Acute Placement   Other Concerns:    SOCIAL WORK ASSESSMENT / PLAN CSW unable to assess patient directly, MD has had extensive conversations w family and patient regarding patient's wish for hospice home placement for end of life care.  Spoke w patient's wife and children in ICU waiting room.  Patient has metastatic lung cancer, has been receiving treatment and now wishes to discontinue therapies and transition to full comfort care.  Wife and family are in agreement w patient wishes.    Patient has many family members in ICU waiting room offering support and supporting wife.  Wife very appreciative of good care being taken of patient during his illness, also appreciative of family members who are caring for and supporting her.    Explained referral to hospice home process, family aware that [facility] staff will meet w them approx 1 PM today to discuss admission to hospice home.  Patient will transfer via EMS. CSW offered support and empathized w family regarding end of life issues and affirmed their decision to provide care at hospice home.   Assessment/plan status:  Psychosocial Support/Ongoing Assessment of Needs Other assessment/ plan:   Information/referral to community resources:    Referral to hospice home    PATIENT'S/FAMILY'S RESPONSE TO PLAN OF CARE: Wife wants to know if she can spend night w patient at hospice home, wants someone there w him 24/7.  CSW advised that she can ask questions re policies of HH w [facility] when she meets w family.        Edwyna Shell, LCSW Clinical Social Worker [phone])

## 2013-10-05 NOTE — Clinical Social Work Note (Signed)
Meredith from [facility] met w family, is willing to offer bed at hospice home if needed.  Informed that may anticipate in-hospital death given patient's current condition, if stabilizes enough for transfer they can offer a bed tomorrow AM.  Edwyna Shell, LCSW Clinical Social Worker [phone])

## 2013-10-05 NOTE — Progress Notes (Signed)
Patient still resting comfortably with family at bedside.  Son just arrived from [place], [place] and at bedside.  Family stated they felt patient was comfortable and did not want him repositioned or disturbed at this time.  Will continue to monitor.  Schonewitz, Eulis Canner 10/05/2013

## 2013-10-05 NOTE — Progress Notes (Signed)
[facility] PROGRESS NOTE Assessment/Plan: Acute diverticulitis -Place on iv primaxin. Ivf. Iv dilaudid and zofran prn.  -Pain free in the abdomen. - d/w family and pt him and family will like to move towards comfort care. - he will like to go to a hospice house. - he will like antibiotics stopped.  Gram-positive cocci in chain bacteremia: - 2/2 BC positive Gram + cocci in chain. -Patient currently on vancomycin and Primaxin which should cover organisms. Await speciation and sensitivities   Metastatic non-small cell lung cancer(NSCLC)/ Pleural effusion, malignant : - Patient has metastasis to the brain, peritoneal wall, and has a malignant pleural effusion left side.  - November 2014 patient had a thoracentesis with pleural biopsy confirming metastasis/malignant pleural effusion. Fax was declined at that time secondary to cardiothoracic surgery believing patient would not survive procedure  - RN Kathyrn Drown confirm that they have refused hospice in the past.  - On 2.27.2015 had a long conversation with Pt and family and he relates he is tired he is in pain. After explaining to him the risk and benefit he agreed to move toward comfort care.  Malignant melanoma  -See Metastatic NSCLC  Chemotherapy induced neutropenia  -DC neutropenic precautions, on 2/26 ANC=5336  -Follow closely all cell lines  -Continue antibiotics.  Thrombocytopenia  -Would transfuse if drops below 10,000 or active bleeding  -2/26 improving but still low   Left loculated pleural effusion  -See metastatic NSCLC  Atrial fibrillation  -Currently in NSR  -Continue Coreg   Chemotherapy induced neutropenia - now improved.   Code Status: DNR Family Communication: wife and close fmaily  Disposition Plan: inpatient, hopfully hospice home   Consultants:  none  Procedures:  CT abd  Antibiotics: Primaxin 2/25>> 2.27 Vancomycin 2/25>>2.27   HPI/Subjective: Tired.   Objective: Filed Vitals:     10/05/13 0700 10/05/13 0730 10/05/13 0800 10/05/13 0900  BP: 150/95  143/88 147/97  Pulse:   115 122  Temp:  97.5 F (36.4 C)    TempSrc:  Oral    Resp: 29  25 27   Height:      Weight:      SpO2:   97% 96%    Intake/Output Summary (Last 24 hours) at 10/05/13 1015 Last data filed at 10/05/13 0600  Gross per 24 hour  Intake 3603.33 ml  Output    975 ml  Net 2628.33 ml   Filed Weights   09/12/2013 2330 10/04/13 0500 10/05/13 0500  Weight: 72.5 kg (159 lb 13.3 oz) 76.8 kg (169 lb 5 oz) 77.202 kg (170 lb 3.2 oz)    Exam:  General: Alert, awake, oriented x3, in no acute distress. cachectic HEENT: No bruits, no goiter.  Heart: tachycardic Abdomen: Soft, nontender, nondistended, positive bowel sounds.    Data Reviewed: Basic Metabolic Panel:  Recent Labs Lab 09/23/2013 1821 10/03/13 0413 10/04/13 0506 10/05/13 0407  NA 130* 135* 136* 135*  K 4.9 4.5 4.6 4.4  CL 94* 101 101 101  CO2 27 24 22 21   GLUCOSE 82 52* 70 84  BUN 32* 34* 37* 35*  CREATININE 0.79 0.86 0.85 0.74  CALCIUM 8.3* 7.5* 8.2* 8.4  MG  --   --  1.5 1.5   Liver Function Tests:  Recent Labs Lab 09/13/2013 1821 10/04/13 0506 10/05/13 0407  AST 17 13 13   ALT 21 17 14   ALKPHOS 95 96 139*  BILITOT 1.6* 0.8 0.7  PROT 5.9* 5.3* 5.6*  ALBUMIN 2.2* 1.6* 1.6*   No results found  for this basename: LIPASE, AMYLASE,  in the last 168 hours No results found for this basename: AMMONIA,  in the last 168 hours CBC:  Recent Labs Lab 09/09/2013 1821 10/03/13 0413 10/04/13 0506 10/05/13 0407  WBC 0.3* 0.5* 4.6 10.7*  NEUTROABS  --   --  4.4 10.5*  HGB 12.9* 12.1* 11.2* 11.8*  HCT 40.2 37.6* 34.3* 36.2*  MCV 86.1 86.8 86.2 85.6  PLT 33* 27* 35* 36*   Cardiac Enzymes: No results found for this basename: CKTOTAL, CKMB, CKMBINDEX, TROPONINI,  in the last 168 hours BNP (last 3 results)  Recent Labs  10/04/13 0506  PROBNP 1422.0*   CBG: No results found for this basename: GLUCAP,  in the last 168  hours  Recent Results (from the past 240 hour(s))  CULTURE, BLOOD (ROUTINE X 2)     Status: None   Collection Time    09/27/2013  8:41 PM      Result Value Ref Range Status   Specimen Description BLOOD PICC LINE DRAWN BY RN   Final   Special Requests BOTTLES DRAWN AEROBIC AND ANAEROBIC [facility] EACH   Final   Culture  Setup Time     Final   Value: 10/04/2013 00:09     Performed at [facility]   Culture     Final   Value: GRAM POSITIVE COCCI IN CHAINS     Note: Gram Stain Report Called to,Read Back By and Verified With: ROWE NADINE AT 1834 10/03/13 BY Elza Rafter Performed at [facility]     Performed at [facility]   Report Status PENDING   Incomplete  CULTURE, BLOOD (ROUTINE X 2)     Status: None   Collection Time    09/25/2013  8:46 PM      Result Value Ref Range Status   Specimen Description BLOOD LEFT WRIST   Final   Special Requests BOTTLES DRAWN AEROBIC AND ANAEROBIC 4CC EACH   Final   Culture  Setup Time     Final   Value: 10/04/2013 00:08     Performed at [facility]   Culture     Final   Value: GRAM POSITIVE COCCI IN CHAINS     Note: Gram Stain Report Called to,Read Back By and Verified With: ROWE NADINE AT 1834 10/03/13 BY Moshe Salisbury Performed at [facility]     Performed at [facility]   Report Status PENDING   Incomplete  MRSA PCR SCREENING     Status: None   Collection Time    09/13/2013 10:40 PM      Result Value Ref Range Status   MRSA by PCR NEGATIVE  NEGATIVE Final   Comment:            The GeneXpert MRSA Assay (FDA     approved for NASAL specimens     only), is one component of a     comprehensive MRSA colonization     surveillance program. It is not     intended to diagnose MRSA     infection nor to guide or     monitor treatment for     MRSA infections.     Studies: No results found.  Scheduled Meds: . aspirin EC  81 mg Oral Daily  . bacitracin  10 application Topical BID  . carvedilol  6.25 mg Oral BID WC   . dexamethasone  4 mg Oral TID  . enoxaparin (LOVENOX) injection  40 mg Subcutaneous  Q24H  . feeding supplement (PRO-STAT SUGAR FREE 64)  30 mL Per Tube BID  . feeding supplement (VITAL AF 1.2 CAL)  1,000 mL Per Tube Q24H  . imipenem-cilastatin  500 mg Intravenous Q8H  . megestrol  800 mg Oral Daily  . oxybutynin  5 mg Oral Daily  . pantoprazole  40 mg Oral Daily  . vancomycin  1,000 mg Intravenous Q12H   Continuous Infusions: . sodium chloride 100 mL/hr at 10/05/13 0600    Time spent: 90 minutes Charlynne Cousins  [facility] Pager [phone].  If 8PM-8AM, please contact night-coverage at [email or web address], password [facility] 10/05/2013, 10:15 AM  LOS: 3 days

## 2013-10-05 NOTE — Progress Notes (Signed)
Patient restless, family at bedside trying to reassure him.  Will continue to monitor patient. Schonewitz, Eulis Canner 10/05/2013

## 2013-10-05 NOTE — Progress Notes (Signed)
ON CALL [facility] CALLED AND IS NOW AT BEDSIDE W/ PT AND HIS FAMILY.

## 2013-10-06 DIAGNOSIS — Z515 Encounter for palliative care: Secondary | ICD-10-CM

## 2013-10-06 LAB — URINE CULTURE: Colony Count: 70000

## 2013-10-06 MED ORDER — SODIUM CHLORIDE 0.9 % IV SOLN
1.0000 mg/h | INTRAVENOUS | Status: DC
  Administered 2013-10-06: 1 mg/h via INTRAVENOUS
  Filled 2013-10-06: qty 10

## 2013-10-06 MED ORDER — SCOPOLAMINE 1 MG/3DAYS TD PT72
1.0000 | MEDICATED_PATCH | TRANSDERMAL | Status: DC
  Administered 2013-10-06: 1.5 mg via TRANSDERMAL
  Filled 2013-10-06: qty 1

## 2013-10-06 MED ORDER — MORPHINE SULFATE 10 MG/ML IJ SOLN
INTRAMUSCULAR | Status: AC
  Filled 2013-10-06: qty 10

## 2013-10-06 MED ORDER — ENOXAPARIN SODIUM 40 MG/0.4ML ~~LOC~~ SOLN: 40.0000 mg | SUBCUTANEOUS | Status: DC

## 2013-10-06 NOTE — Progress Notes (Addendum)
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 He was started on antibiotics for acute diverticulitis. Due to significant decline family opted for comfort care. Pt is now on morphine IV PRN for comfort care. Family at the bedside.   Hospital Course:   Acute diverticulitis  - sudden turn of the events; pt clinically deteriorated; not stable for discharge - requires morphine 4 mg every 2 hours IV PRN - per family wished comfort care - all antibiotics discontinued  Gram-positive cocci in chain bacteremia:  - gram positive cocci in chains; was on antibiotics but all d/c'ed to ensure comfort  Chemotherapy induced neutropenia  - no further blood draws to ensure comfort  Thrombocytopenia  - no transfusion or blood draws to ensure comfort  Left loculated pleural effusion  - comfort care Atrial fibrillation  - in sinus rhythm Chemotherapy induced neutropenia  - comfort care   Code Status: DNR/DNI Family Communication: family at the bedside  Disposition Plan: remains inpatient   Leisa Lenz, MD  [facility] Pager [phone]  If 7PM-7AM, please contact night-coverage [email or web address] Password [facility] 10/06/2013, 5:43 PM   LOS: 4 days    Antibiotics:  None   HPI/Subjective: No acute overnight events.   Objective: Filed Vitals:   10/06/13 1615 10/06/13 1630 10/06/13 1645 10/06/13 1700  BP:      Pulse:      Temp:  98.6 F (37 C)    TempSrc:  Axillary    Resp: 15 14 14 14   Height:      Weight:      SpO2:       No intake or output data in the 24 hours ending 10/06/13 1743  Exam:   General:  Pt is lethargic  Cardiovascular: S1/S2 appreciated  Respiratory:  gurgling sounds, more periods of apnea  Abdomen: Soft, non tender, non distended, bowel sounds present  Extremities: Pulses DP and PT palpable bilaterally  Neuro: Grossly nonfocal  Data Reviewed: Basic Metabolic Panel:  Recent Labs Lab 10/01/2013 1821 10/03/13 0413 10/04/13 0506 10/05/13 0407  NA 130* 135* 136* 135*  K 4.9 4.5 4.6 4.4  CL 94* 101 101 101  CO2 27 24 22 21   GLUCOSE 82 52* 70 84  BUN 32* 34* 37* 35*  CREATININE 0.79 0.86 0.85 0.74  CALCIUM 8.3* 7.5* 8.2* 8.4  MG  --   --  1.5 1.5   Liver Function Tests:  Recent Labs Lab 09/09/2013 1821 10/04/13 0506 10/05/13 0407  AST 17 13 13   ALT 21 17 14   ALKPHOS 95 96 139*  BILITOT 1.6* 0.8 0.7  PROT 5.9* 5.3* 5.6*  ALBUMIN 2.2* 1.6* 1.6*   No results found for this basename: LIPASE, AMYLASE,  in the last 168 hours No results found for this basename: AMMONIA,  in the last 168 hours CBC:  Recent Labs Lab 09/16/2013 1821 10/03/13 0413 10/04/13 0506 10/05/13 0407  WBC 0.3* 0.5* 4.6 10.7*  NEUTROABS  --   --  4.4 10.5*  HGB 12.9* 12.1* 11.2* 11.8*  HCT 40.2 37.6* 34.3* 36.2*  MCV 86.1 86.8 86.2 85.6  PLT 33* 27* 35* 36*   Cardiac Enzymes: No results found for this basename: CKTOTAL, CKMB, CKMBINDEX, TROPONINI,  in the last  168 hours BNP: No components found with this basename: POCBNP,  CBG: No results found for this basename: GLUCAP,  in the last 168 hours  CULTURE, BLOOD (ROUTINE X 2)     Status: None   Collection Time    09/26/2013  8:41 PM      Result Value Ref Range Status   Specimen Description BLOOD PICC LINE DRAWN BY RN   Final   Value: GROUP A STREP (S.PYOGENES) ISOLATED     Performed at [facility]   Report Status 10/05/2013 FINAL   Final  CULTURE, BLOOD (ROUTINE X 2)     Status: None   Collection Time    09/13/2013  8:46 PM      Result Value Ref Range Status   Specimen Description BLOOD LEFT WRIST   Final   Value: GROUP A STREP (S.PYOGENES) ISOLATED     Performed at [facility]   Report Status 10/05/2013 FINAL   Final  MRSA PCR SCREENING     Status: None   Collection Time    09/26/2013 10:40 PM      Result Value Ref Range Status   MRSA by PCR NEGATIVE  NEGATIVE Final  URINE CULTURE     Status: None   Collection Time    10/03/13  6:00 AM      Result Value Ref Range Status   Specimen Description URINE, CLEAN CATCH   Final   Value: ENTEROCOCCUS SPECIES     Performed at [facility]   Report Status 10/06/2013 FINAL   Final   Organism ID, Bacteria ENTEROCOCCUS SPECIES   Final     Studies: No results found.  Scheduled Meds: . enoxaparin (LOVENOX)   40 mg Subcutaneous Q24H  . LORazepam  0.5 mg Intravenous Once  .  morphine injection  4 mg Intravenous Q2H  . scopolamine  1 patch Transdermal Q72H

## 2013-10-07 DEATH — deceased

## 2013-10-15 NOTE — Discharge Summary (Signed)
  Death Summary  Stephen Martin OVF:[id number] DOB: 1931/04/25 DOA: 29-Oct-2013  PCP: [name], MD PCP/Office notified  Admit date: 10-29-13 Date of Death: 11-11-2013  Final Diagnoses:  Principal Problem:   Acute diverticulitis Active Problems:   Chronic anticoagulation   Atrial fibrillation   Non-small cell lung cancer   Nonsustained ventricular tachycardia   Pleural effusion, malignant   Cardiomyopathy   Loculated pleural effusion   Pancreatic insufficiency   Sinus tachycardia   Chemotherapy induced neutropenia   Colitis   Thrombocytopenia, unspecified   Gram-positive cocci bacteremia   Hospice care   History of present illness:  80 yo male with 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 He was started on antibiotics for acute diverticulitis. Due to significant decline family opted for comfort care. Pt is now on morphine IV PRN for comfort care. Family at the bedside.   Hospital Course:   Acute diverticulitis  - sudden turn of the events; pt clinically deteriorated; not stable for discharge  - requires morphine 4 mg every 2 hours IV PRN  - per family wished comfort care  - all antibiotics discontinued  Gram-positive cocci in chain bacteremia:  - gram positive cocci in chains; was on antibiotics but all d/c'ed to ensure comfort  Chemotherapy induced neutropenia  - no further blood draws to ensure comfort  Thrombocytopenia  - no transfusion or blood draws to ensure comfort  Left loculated pleural effusion  - comfort care  Atrial fibrillation  - in sinus rhythm  Chemotherapy induced neutropenia  - comfort care   Code Status: DNR/DNI   Leisa Lenz, MD  [facility]  Pager [phone]    Time: 5 am 2013/11/03  Signed:  Leisa Lenz  [facility] November 11, 2013, 10:03 PM

## 2013-11-07 NOTE — Progress Notes (Addendum)
No respirtions, no pulse or bp. Notified md on call. Family at beside

## 2013-11-07 NOTE — Progress Notes (Signed)
[place] [facility] notified. Not a candidate for refferal

## 2013-11-07 DEATH — deceased

## 2014-08-22 ENCOUNTER — Encounter (HOSPITAL_COMMUNITY): Payer: Self-pay | Admitting: General Surgery
